# Patient Record
Sex: Female | Born: 1970
Health system: Southern US, Community
[De-identification: ages and names within clinical notes are randomized; demographics above are authoritative.]

## PROBLEM LIST (undated history)

## (undated) DIAGNOSIS — Z862 Personal history of diseases of the blood and blood-forming organs and certain disorders involving the immune mechanism: Secondary | ICD-10-CM

## (undated) DIAGNOSIS — T7840XA Allergy, unspecified, initial encounter: Secondary | ICD-10-CM

## (undated) DIAGNOSIS — I10 Essential (primary) hypertension: Secondary | ICD-10-CM

## (undated) HISTORY — DX: Personal history of diseases of the blood and blood-forming organs and certain disorders involving the immune mechanism: Z86.2

## (undated) HISTORY — DX: Essential (primary) hypertension: I10

## (undated) HISTORY — DX: Allergy, unspecified, initial encounter: T78.40XA

---

## 2002-01-09 HISTORY — PX: EYE SURGERY: SHX253

## 2010-01-09 DIAGNOSIS — Z862 Personal history of diseases of the blood and blood-forming organs and certain disorders involving the immune mechanism: Secondary | ICD-10-CM

## 2010-01-09 HISTORY — DX: Personal history of diseases of the blood and blood-forming organs and certain disorders involving the immune mechanism: Z86.2

## 2010-01-09 HISTORY — PX: EYE SURGERY: SHX253

## 2013-01-09 HISTORY — PX: EYE SURGERY: SHX253

## 2013-07-12 ENCOUNTER — Emergency Department (HOSPITAL_COMMUNITY)
Admission: EM | Admit: 2013-07-12 | Discharge: 2013-07-12 | Disposition: A | Discharge status: Home / Self Care | Payer: No Typology Code available for payment source | Attending: Emergency Medicine | Admitting: Emergency Medicine

## 2013-07-12 ENCOUNTER — Emergency Department (HOSPITAL_COMMUNITY): Payer: No Typology Code available for payment source

## 2013-07-12 ENCOUNTER — Encounter (HOSPITAL_COMMUNITY): Payer: Self-pay | Admitting: Emergency Medicine

## 2013-07-12 DIAGNOSIS — S6990XA Unspecified injury of unspecified wrist, hand and finger(s), initial encounter: Secondary | ICD-10-CM | POA: Diagnosis present

## 2013-07-12 DIAGNOSIS — Y9239 Other specified sports and athletic area as the place of occurrence of the external cause: Secondary | ICD-10-CM | POA: Diagnosis not present

## 2013-07-12 DIAGNOSIS — M25531 Pain in right wrist: Secondary | ICD-10-CM

## 2013-07-12 DIAGNOSIS — S59909A Unspecified injury of unspecified elbow, initial encounter: Secondary | ICD-10-CM | POA: Insufficient documentation

## 2013-07-12 DIAGNOSIS — Y92838 Other recreation area as the place of occurrence of the external cause: Secondary | ICD-10-CM

## 2013-07-12 DIAGNOSIS — S59919A Unspecified injury of unspecified forearm, initial encounter: Principal | ICD-10-CM

## 2013-07-12 DIAGNOSIS — Y9389 Activity, other specified: Secondary | ICD-10-CM | POA: Diagnosis not present

## 2013-07-12 MED ORDER — IBUPROFEN 800 MG PO TABS
800.0000 mg | ORAL_TABLET | Freq: Once | ORAL | Status: AC
  Administered 2013-07-12: 800 mg via ORAL
  Filled 2013-07-12: qty 1

## 2013-07-12 MED ORDER — HYDROCODONE-ACETAMINOPHEN 5-325 MG PO TABS
1.0000 | ORAL_TABLET | Freq: Once | ORAL | Status: DC
  Filled 2013-07-12: qty 1

## 2013-07-12 MED ORDER — IBUPROFEN 600 MG PO TABS: 600.0000 mg | ORAL_TABLET | Freq: Four times a day (QID) | ORAL | Status: DC | PRN

## 2013-07-12 MED ORDER — HYDROCODONE-ACETAMINOPHEN 5-325 MG PO TABS: 1.0000 | ORAL_TABLET | Freq: Four times a day (QID) | ORAL | Status: DC | PRN
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
## 2013-07-12 NOTE — ED Provider Notes (Signed)
Medical screening examination/treatment/procedure(s) were performed by non-physician practitioner and as supervising physician I was immediately available for consultation/collaboration.   EKG Interpretation None        Rudell Marlowe J. Jeyson Deshotel, MD 07/12/13 1525 
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
## 2016-01-21 ENCOUNTER — Ambulatory Visit (INDEPENDENT_AMBULATORY_CARE_PROVIDER_SITE_OTHER): Payer: PRIVATE HEALTH INSURANCE | Admitting: Family

## 2016-01-21 ENCOUNTER — Encounter: Payer: Self-pay | Admitting: Family

## 2016-01-21 VITALS — BP 125/77 | HR 100 | Temp 98.1°F | Resp 18 | Ht 67.0 in | Wt 204.0 lb

## 2016-01-21 DIAGNOSIS — Z862 Personal history of diseases of the blood and blood-forming organs and certain disorders involving the immune mechanism: Secondary | ICD-10-CM | POA: Diagnosis not present

## 2016-01-21 DIAGNOSIS — Z8669 Personal history of other diseases of the nervous system and sense organs: Secondary | ICD-10-CM | POA: Insufficient documentation

## 2016-01-21 DIAGNOSIS — I10 Essential (primary) hypertension: Secondary | ICD-10-CM | POA: Diagnosis not present

## 2016-01-21 DIAGNOSIS — Z0000 Encounter for general adult medical examination without abnormal findings: Secondary | ICD-10-CM

## 2016-01-21 LAB — CBC WITH DIFFERENTIAL/PLATELET
BASOS ABS: 0.1 10*3/uL (ref 0.0–0.1)
Basophils Relative: 0.9 % (ref 0.0–3.0)
EOS ABS: 0 10*3/uL (ref 0.0–0.7)
Eosinophils Relative: 0.2 % (ref 0.0–5.0)
HCT: 38.3 % (ref 36.0–46.0)
Hemoglobin: 12.9 g/dL (ref 12.0–15.0)
LYMPHS ABS: 2.3 10*3/uL (ref 0.7–4.0)
Lymphocytes Relative: 28.3 % (ref 12.0–46.0)
MCHC: 33.7 g/dL (ref 30.0–36.0)
MCV: 79.2 fl (ref 78.0–100.0)
MONOS PCT: 4.1 % (ref 3.0–12.0)
Monocytes Absolute: 0.3 10*3/uL (ref 0.1–1.0)
Neutro Abs: 5.5 10*3/uL (ref 1.4–7.7)
Neutrophils Relative %: 66.5 % (ref 43.0–77.0)
Platelets: 334 10*3/uL (ref 150.0–400.0)
RBC: 4.84 Mil/uL (ref 3.87–5.11)
RDW: 14.9 % (ref 11.5–15.5)
WBC: 8.3 10*3/uL (ref 4.0–10.5)

## 2016-01-21 LAB — BASIC METABOLIC PANEL
BUN: 20 mg/dL (ref 6–23)
CO2: 28 meq/L (ref 19–32)
CREATININE: 0.91 mg/dL (ref 0.40–1.20)
Calcium: 10.2 mg/dL (ref 8.4–10.5)
Chloride: 98 mEq/L (ref 96–112)
GFR: 70.88 mL/min (ref >60.00)
GLUCOSE: 88 mg/dL (ref 70–99)
Potassium: 3.3 mEq/L — ABNORMAL LOW (ref 3.5–5.1)
Sodium: 135 mEq/L (ref 135–145)

## 2016-01-21 MED ORDER — HYDROCHLOROTHIAZIDE 12.5 MG PO TABS: 12.5000 mg | ORAL_TABLET | Freq: Every day | ORAL | 1 refills | Status: DC

## 2016-01-21 NOTE — Progress Notes (Signed)
Subjective:    Patient ID: Evelyn Martin, female    DOB: 1970/11/21, 46 y.o.   MRN: [id number]  HPI  Ms. Evelyn Martin is a 46 yr old female who presents today to establish care.  [place]  Reports that she went to pharmacy and had BP 167/101, went to a clinic and was placed on hctz.  BP: 125/77 Had HA and dizziness daily until Saturday.   Hx of anemia (2012)- she reports that this was picked up incidentally on her annual exam.   Hx of detached retina right eye (scleral buccal), Macular hole right eye (s/p vitrectomy), has had laser surgery left eye for sign  Review of Systems  Constitutional: Negative for unexpected weight change.       Has lost 11 pounds since christmas with a new eating plan  HENT: Negative for rhinorrhea.   Eyes: Positive for visual disturbance.       Some vision in the lower left quadrant of the right eye  Respiratory: Negative for cough.   Cardiovascular: Negative for leg swelling.  Gastrointestinal: Negative for constipation and diarrhea.  Genitourinary: Negative for dysuria, frequency and menstrual problem.  Musculoskeletal: Negative for arthralgias and myalgias.  Neurological: Negative for headaches.  Hematological: Negative for adenopathy.  Psychiatric/Behavioral:       Denies depression/anxiety   Past Medical History:  Diagnosis Date  . History of anemia 2012     Social History   Social History  . Marital status: Single    Spouse name: N/A  . Number of children: N/A  . Years of education: N/A   Occupational History  . Not on file.   Social History Main Topics  . Smoking status: Never Smoker  . Smokeless tobacco: Never Used  . Alcohol use Yes     Comment: 2-3 beverages a month  . Drug use: No  . Sexual activity: Not on file   Other Topics Concern  . Not on file   Social History Narrative  . No narrative on file    Past Surgical History:  Procedure Laterality Date  . EYE SURGERY Right 2004   detached retnia  .  EYE SURGERY Right 2012   macular hole repair  . EYE SURGERY Left 2015   last retina repair    Family History  Problem Relation Age of Onset  . Hypertension Mother   . Hypertension Sister     No Known Allergies  No current outpatient prescriptions on file prior to visit.   No current facility-administered medications on file prior to visit.     BP 125/77 (BP Location: Left Arm, Cuff Size: Large)   Pulse 100   Temp 98.1 F (36.7 C) (Oral)   Resp 18   Ht 5\' 7"  (1.702 m)   Wt 204 lb (92.5 kg)   LMP 12/26/2015   SpO2 100% Comment: room air  BMI 31.95 kg/m       Objective:   Physical Exam  Constitutional: She is oriented to person, place, and time. She appears well-developed and well-nourished.  HENT:  Head: Normocephalic and atraumatic.  Right Ear: Tympanic membrane and ear canal normal.  Left Ear: Tympanic membrane and ear canal normal.  Cardiovascular: Normal rate, regular rhythm and normal heart sounds.   No murmur heard. Pulmonary/Chest: Effort normal and breath sounds normal. No respiratory distress. She has no wheezes.  Musculoskeletal: She exhibits no edema.  Neurological: She is alert and oriented to person, place, and time.  Psychiatric: She  has a normal mood and affect. Her behavior is normal. Judgment and thought content normal.          Assessment & Plan:

## 2016-01-21 NOTE — Assessment & Plan Note (Signed)
Clinically stable.  Obtain follow up cbc.  

## 2016-01-21 NOTE — Assessment & Plan Note (Signed)
Stable on low dose hctz, continue same.  Obtain follow up BMET.

## 2016-01-21 NOTE — Patient Instructions (Addendum)
Please complete lab work prior to leaving. You will be contacted about your referral to GYN and the Retinal Specialist.  [facility]

## 2016-01-21 NOTE — Assessment & Plan Note (Signed)
Clinically stable. Will refer to Dr. Luciana Axeankin (retinal specialist for ongoing care.

## 2016-01-23 ENCOUNTER — Other Ambulatory Visit: Payer: Self-pay | Admitting: Family

## 2016-01-23 DIAGNOSIS — E876 Hypokalemia: Secondary | ICD-10-CM

## 2016-01-23 NOTE — Telephone Encounter (Signed)
Please let pt know that potassium is low. I would like her to add kdur once daily and repeat bmet in 2 week.

## 2016-01-24 NOTE — Telephone Encounter (Signed)
Left message for pt to return my call.

## 2016-01-28 MED ORDER — POTASSIUM CHLORIDE CRYS ER 20 MEQ PO TBCR: 20.0000 meq | EXTENDED_RELEASE_TABLET | Freq: Every day | ORAL | 3 refills | Status: DC

## 2016-01-28 NOTE — Telephone Encounter (Signed)
Attempted to reach pt and left detailed message on cell# re: below instruction. Rx sent. Advised pt to call Monday to schedule lab visit in 2 week. Future order entered.

## 2016-02-14 ENCOUNTER — Encounter: Payer: Self-pay | Admitting: Family

## 2016-02-14 ENCOUNTER — Ambulatory Visit (INDEPENDENT_AMBULATORY_CARE_PROVIDER_SITE_OTHER): Payer: PRIVATE HEALTH INSURANCE | Admitting: Family

## 2016-02-14 ENCOUNTER — Telehealth: Payer: Self-pay | Admitting: Family

## 2016-02-14 VITALS — BP 130/77 | HR 97 | Temp 98.7°F | Ht 67.0 in | Wt 202.8 lb

## 2016-02-14 DIAGNOSIS — Z8669 Personal history of other diseases of the nervous system and sense organs: Secondary | ICD-10-CM

## 2016-02-14 DIAGNOSIS — E876 Hypokalemia: Secondary | ICD-10-CM | POA: Diagnosis not present

## 2016-02-14 DIAGNOSIS — I10 Essential (primary) hypertension: Secondary | ICD-10-CM | POA: Diagnosis not present

## 2016-02-14 LAB — BASIC METABOLIC PANEL
BUN: 14 mg/dL (ref 6–23)
CHLORIDE: 104 meq/L (ref 96–112)
CO2: 27 mEq/L (ref 19–32)
CREATININE: 0.92 mg/dL (ref 0.40–1.20)
Calcium: 10 mg/dL (ref 8.4–10.5)
GFR: 69.97 mL/min (ref >60.00)
Glucose, Bld: 116 mg/dL — ABNORMAL HIGH (ref 70–99)
Potassium: 3.9 mEq/L (ref 3.5–5.1)
Sodium: 137 mEq/L (ref 135–145)

## 2016-02-14 NOTE — Progress Notes (Signed)
   Subjective:    Patient ID: Evelyn Martin, female    DOB: Aug 21, 1970, 46 y.o.   MRN: [id number]  HPI  Ms. Evelyn Martin is a 46 yr old female who presents today for follow up.  1) HTN- she continues hctz. Has been watching her sodium intake.   Reports that last week she did not take Kdur over the weekend.  Felt fatigued and thought that Kdur might be the cause.    BP Readings from Last 3 Encounters:  02/14/16 130/77  01/21/16 125/77  07/12/13 135/61      Review of Systems See HPI  Past Medical History:  Diagnosis Date  . History of anemia 2012     Social History   Social History  . Marital status: Single    Spouse name: N/A  . Number of children: N/A  . Years of education: N/A   Occupational History  . Not on file.   Social History Main Topics  . Smoking status: Never Smoker  . Smokeless tobacco: Never Used  . Alcohol use Yes     Comment: 2-3 beverages a month  . Drug use: No  . Sexual activity: Not on file   Other Topics Concern  . 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 EYE SURGERY Right 2004   detached retnia  . EYE SURGERY Right 2012   macular hole repair  . EYE SURGERY Left 2015   last retina repair    Family History  Problem Relation Age of Onset  . Hypertension Mother   . Hypertension Sister     No Known Allergies  Current Outpatient Prescriptions on File Prior to Visit  Medication Sig Dispense Refill  . hydrochlorothiazide (HYDRODIURIL) 12.5 MG tablet Take 1 tablet (12.5 mg total) by mouth daily. 90 tablet 1  . potassium chloride SA (K-DUR,KLOR-CON) 20 MEQ tablet Take 1 tablet (20 mEq total) by mouth daily. 30 tablet 3   No current facility-administered medications on file prior to visit.     BP 130/77 (BP Location: Left Arm, Cuff Size: Large)   Pulse 97   Temp 98.7 F (37.1 C) (Oral)    Ht 5\' 7"  (1.702 m)   Wt 202 lb 12.8 oz (92 kg)   LMP 01/25/2016   SpO2 100%   BMI 31.76 kg/m       Objective:   Physical Exam  Constitutional: She is oriented to person, place, and time. She appears well-developed and well-nourished.  Cardiovascular: Normal rate, regular rhythm and normal heart sounds.   No murmur heard. Pulmonary/Chest: Effort normal and breath sounds normal. No respiratory distress. She has no wheezes.  Musculoskeletal: She exhibits no edema.  Neurological: She is alert and oriented to person, place, and time.  Psychiatric: She has a normal mood and affect. Her behavior is normal. Judgment and thought content normal.          Assessment & Plan:

## 2016-02-14 NOTE — Assessment & Plan Note (Signed)
BP stable, obtain follow up bmet today to follow up on her hypokalemia. Advised pt that fatigue was likely not secondary to kdur.

## 2016-02-14 NOTE — Telephone Encounter (Signed)
Dr Luciana Axeankin 02/29/16 @8 :45am [facility] [address] [place], [place] [address]  Tel: [phone] Pt aware, left detail message, ok per [facility]

## 2016-02-14 NOTE — Telephone Encounter (Signed)
Could you please follow up on referral to opthalmology. Pt states she has not been contacted.  Thanks.

## 2016-02-14 NOTE — Assessment & Plan Note (Signed)
She states she has not yet heard back from Dr. Ephriam Knucklesankin's office. Will check status of referral.

## 2016-02-14 NOTE — Patient Instructions (Signed)
Please complete lab work prior to leaving.   

## 2016-02-14 NOTE — Progress Notes (Signed)
Pre visit review using our clinic review tool, if applicable. No additional management support is needed unless otherwise documented below in the visit note. 
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
## 2016-05-09 ENCOUNTER — Encounter: Payer: Self-pay | Admitting: Family

## 2016-05-09 ENCOUNTER — Ambulatory Visit (INDEPENDENT_AMBULATORY_CARE_PROVIDER_SITE_OTHER): Payer: PRIVATE HEALTH INSURANCE | Admitting: Family

## 2016-05-09 VITALS — BP 136/79 | HR 82 | Temp 98.2°F | Resp 16 | Ht 67.0 in | Wt 196.2 lb

## 2016-05-09 DIAGNOSIS — Z23 Encounter for immunization: Secondary | ICD-10-CM | POA: Diagnosis not present

## 2016-05-09 DIAGNOSIS — Z0000 Encounter for general adult medical examination without abnormal findings: Secondary | ICD-10-CM

## 2016-05-09 LAB — URINALYSIS, ROUTINE W REFLEX MICROSCOPIC
Bilirubin Urine: NEGATIVE
Hgb urine dipstick: NEGATIVE
Ketones, ur: NEGATIVE
Leukocytes, UA: NEGATIVE
Nitrite: NEGATIVE
PH: 6.5 (ref 5.0–8.0)
RBC / HPF: NONE SEEN (ref 0–?)
SPECIFIC GRAVITY, URINE: 1.01 (ref 1.000–1.030)
Total Protein, Urine: NEGATIVE
Urine Glucose: NEGATIVE
Urobilinogen, UA: 0.2 (ref 0.0–1.0)

## 2016-05-09 LAB — BASIC METABOLIC PANEL
BUN: 17 mg/dL (ref 6–23)
CO2: 29 mEq/L (ref 19–32)
Calcium: 10 mg/dL (ref 8.4–10.5)
Chloride: 101 mEq/L (ref 96–112)
Creatinine, Ser: 1.04 mg/dL (ref 0.40–1.20)
GFR: 60.68 mL/min (ref >60.00)
Glucose, Bld: 103 mg/dL — ABNORMAL HIGH (ref 70–99)
Potassium: 3.9 mEq/L (ref 3.5–5.1)
SODIUM: 136 meq/L (ref 135–145)

## 2016-05-09 LAB — CBC WITH DIFFERENTIAL/PLATELET
BASOS PCT: 1.1 % (ref 0.0–3.0)
Basophils Absolute: 0.1 10*3/uL (ref 0.0–0.1)
EOS PCT: 1 % (ref 0.0–5.0)
Eosinophils Absolute: 0.1 10*3/uL (ref 0.0–0.7)
HCT: 38.8 % (ref 36.0–46.0)
HEMOGLOBIN: 12.8 g/dL (ref 12.0–15.0)
Lymphocytes Relative: 34.7 % (ref 12.0–46.0)
Lymphs Abs: 1.9 10*3/uL (ref 0.7–4.0)
MCHC: 33 g/dL (ref 30.0–36.0)
MCV: 80.2 fl (ref 78.0–100.0)
Monocytes Absolute: 0.3 10*3/uL (ref 0.1–1.0)
Monocytes Relative: 6.1 % (ref 3.0–12.0)
Neutro Abs: 3.1 10*3/uL (ref 1.4–7.7)
Neutrophils Relative %: 57.1 % (ref 43.0–77.0)
Platelets: 347 10*3/uL (ref 150.0–400.0)
RBC: 4.84 Mil/uL (ref 3.87–5.11)
RDW: 16.4 % — AB (ref 11.5–15.5)
WBC: 5.5 10*3/uL (ref 4.0–10.5)

## 2016-05-09 LAB — LIPID PANEL
CHOL/HDL RATIO: 4
Cholesterol: 196 mg/dL (ref 0–200)
HDL: 51.9 mg/dL (ref >39.00)
LDL Cholesterol: 113 mg/dL — ABNORMAL HIGH (ref 0–99)
NonHDL: 143.99
TRIGLYCERIDES: 157 mg/dL — AB (ref 0.0–149.0)
VLDL: 31.4 mg/dL (ref 0.0–40.0)

## 2016-05-09 LAB — HEPATIC FUNCTION PANEL
ALT: 41 U/L — ABNORMAL HIGH (ref 0–35)
AST: 23 U/L (ref 0–37)
Albumin: 4.5 g/dL (ref 3.5–5.2)
Alkaline Phosphatase: 45 U/L (ref 39–117)
BILIRUBIN DIRECT: 0.1 mg/dL (ref 0.0–0.3)
TOTAL PROTEIN: 8.2 g/dL (ref 6.0–8.3)
Total Bilirubin: 0.7 mg/dL (ref 0.2–1.2)

## 2016-05-09 LAB — TSH: TSH: 1.97 u[IU]/mL (ref 0.35–4.50)

## 2016-05-09 MED ORDER — POTASSIUM CHLORIDE CRYS ER 20 MEQ PO TBCR: 20.0000 meq | EXTENDED_RELEASE_TABLET | Freq: Every day | ORAL | 1 refills | Status: DC

## 2016-05-09 MED ORDER — HYDROCHLOROTHIAZIDE 12.5 MG PO TABS: 12.5000 mg | ORAL_TABLET | Freq: Every day | ORAL | 1 refills | Status: DC

## 2016-05-09 NOTE — Progress Notes (Addendum)
Subjective:    Patient ID: Evelyn Martin, female    DOB: 01-22-1970, 46 y.o.   MRN: [id number]  HPI   Patient presents today for complete physical.  Immunizations: ? Tetanus 2009 Diet: diet is on/off Exercise: 2 x a week crossfit, walks, gym etc.  Pap Smear: GYN Mammogram: 3 years   Saw [facility], he referred her to Dr. Dione Booze for Cataract.  Has cataract surgery scheduled for June 6th.     Review of Systems  Constitutional: Negative for unexpected weight change.  HENT: Negative for rhinorrhea.   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   Cardiovascular: Negative for leg swelling.  Gastrointestinal: Negative for constipation and diarrhea.  Genitourinary: Negative for dysuria, frequency and menstrual problem.  Musculoskeletal: Negative for arthralgias and myalgias.  Neurological: Negative for headaches.  Hematological: Negative for adenopathy.  Psychiatric/Behavioral:       Denies depression/anxiety       Past Medical History:  Diagnosis Date  . History of anemia 2012     Social History   Social History  . Marital status: Single    Spouse name: N/A  . Number of children: N/A  . Years of education: N/A   Occupational History  . Not on file.   Social History Main Topics  . Smoking status: Never Smoker  . Smokeless tobacco: Never Used  . Alcohol use Yes     Comment: 2-3 beverages a month  . Drug use: No  . Sexual activity: Not on file   Other Topics Concern  . Not on file   Social History Narrative   Lives with fiance   Works in Landscape architect   No children   No pets    From [place]    Past Surgical History:  Procedure Laterality Date  . EYE SURGERY Right 2004   detached retnia  . EYE SURGERY Right 2012   macular hole repair  . EYE SURGERY Left 2015   last retina repair    Family History  Problem Relation Age of Onset  . Hypertension Mother   . Hypertension Sister     No Known Allergies  Current  Outpatient Prescriptions on File Prior to Visit  Medication Sig Dispense Refill  . hydrochlorothiazide (HYDRODIURIL) 12.5 MG tablet Take 1 tablet (12.5 mg total) by mouth daily. 90 tablet 1  . potassium chloride SA (K-DUR,KLOR-CON) 20 MEQ tablet Take 1 tablet (20 mEq total) by mouth daily. 30 tablet 3   No current facility-administered medications on file prior to visit.     BP 136/79 (BP Location: Right Arm, Cuff Size: Large)   Pulse 82   Temp 98.2 F (36.8 C) (Oral)   Resp 16   Ht  (1.702 m)   Wt 196 lb 3.2 oz (89 kg)   LMP 04/22/2016   SpO2 100%   BMI 30.73 kg/m    Objective:   Physical Exam  Physical Exam  Constitutional: She is oriented to person, place, and time. She appears well-developed and well-nourished. No distress.  HENT:  Head: Normocephalic and atraumatic.  Right Ear: Tympanic membrane and ear canal normal.  Left Ear: Tympanic membrane and ear canal normal.  Mouth/Throat: Oropharynx is clear and moist.  Eyes: Pupils are equal, round, and reactive to light. No scleral icterus.  Neck: Normal range of motion. No thyromegaly present.  Cardiovascular: Normal rate and regular rhythm.   No murmur heard. Pulmonary/Chest: Effort normal and breath  sounds normal. No respiratory distress. He has no wheezes. She has no rales. She exhibits no tenderness.  Abdominal: Soft. Bowel sounds are normal. She exhibits no distension and no mass. There is no tenderness. There is no rebound and no guarding.  Musculoskeletal: She exhibits no edema.  Lymphadenopathy:    She has no cervical adenopathy.  Neurological: She is alert and oriented to person, place, and time. She has normal patellar reflexes. She exhibits normal muscle tone. Coordination normal.  Skin: Skin is warm and dry.  Psychiatric: She has a normal mood and affect. Her behavior is normal. Judgment and thought content normal.  Breast: refused Pelvic: deferred to GYN        Assessment & Plan:           Assessment & Plan:  Preventative Care- discussed healthy diet, exercise.  Refer for mammogram- she has a great deal of anxiety surrounding mammogram due to mother's hx of breast CA and previous hx of call back on last mammogram.  We discussed that early detection is key and support was provided. Will give tetanus today.  Obtain routine lab work. Pt will schedule pap smear. EKG tracing is personally reviewed.  EKG notes NSR.  No acute changes.

## 2016-05-09 NOTE — Addendum Note (Signed)
Addended by: Mervin Kung A on: 05/09/2016 04:42 PM   Modules accepted: Orders

## 2016-05-09 NOTE — Patient Instructions (Addendum)
Please complete lab work prior to leaving. Schedule pap smear with GYN. Continue healthy diet and regular exercise.

## 2016-05-09 NOTE — Progress Notes (Signed)
Pre visit review using our clinic review tool, if applicable. No additional management support is needed unless otherwise documented below in the visit note. 

## 2016-05-12 ENCOUNTER — Encounter: Payer: Self-pay | Admitting: Family

## 2016-07-09 HISTORY — PX: CATARACT EXTRACTION: SUR2

## 2016-10-25 ENCOUNTER — Ambulatory Visit (INDEPENDENT_AMBULATORY_CARE_PROVIDER_SITE_OTHER): Payer: PRIVATE HEALTH INSURANCE | Admitting: Family

## 2016-10-25 ENCOUNTER — Encounter: Payer: Self-pay | Admitting: Family

## 2016-10-25 DIAGNOSIS — S39012A Strain of muscle, fascia and tendon of lower back, initial encounter: Secondary | ICD-10-CM | POA: Diagnosis not present

## 2016-10-25 DIAGNOSIS — R300 Dysuria: Secondary | ICD-10-CM | POA: Diagnosis not present

## 2016-10-25 DIAGNOSIS — N3000 Acute cystitis without hematuria: Secondary | ICD-10-CM

## 2016-10-25 LAB — POC URINALSYSI DIPSTICK (AUTOMATED)
BILIRUBIN UA: NEGATIVE
Glucose, UA: NEGATIVE
KETONES UA: NEGATIVE
LEUKOCYTES UA: NEGATIVE
Nitrite, UA: NEGATIVE
PROTEIN UA: NEGATIVE
RBC UA: NEGATIVE
SPEC GRAV UA: 1.01 (ref 1.010–1.025)
Urobilinogen, UA: 0.2 E.U./dL
pH, UA: 6 (ref 5.0–8.0)

## 2016-10-25 MED ORDER — NITROFURANTOIN MONOHYD MACRO 100 MG PO CAPS: 100.0000 mg | ORAL_CAPSULE | Freq: Two times a day (BID) | ORAL | 0 refills | Status: DC

## 2016-10-25 MED ORDER — MELOXICAM 7.5 MG PO TABS: 7.5000 mg | ORAL_TABLET | Freq: Every day | ORAL | 0 refills | Status: DC

## 2016-10-25 MED FILL — NITROFURANTOIN MONO-MCR 100: 100 | 5 days supply | Qty: 10 | Fill #0

## 2016-10-25 MED FILL — MELOXICAM 7.5 MG TABLET: 7.5 | 14 days supply | Qty: 14 | Fill #0

## 2016-10-25 NOTE — Patient Instructions (Addendum)
Please begin macrodantin for urinary tract infection. Begin meloxicam for back pain. Call if new/worsening symptoms or if symptoms fail to improve.

## 2016-10-25 NOTE — Progress Notes (Signed)
Subjective:    Patient ID: Evelyn Martin, female    DOB: Feb 10, 1970, 46 y.o.   MRN: [id number]  HPI   Evelyn Martin is a 46 yr old female who presents today with chief complaint of dysuria. Started 4 days ago.  Has associated frequency, has pelvic pressure. Denies associated fever. Using cystex which helps with the pain.   Reports that she "turned wrong" on Saturday when she was lifting a generator. Using naproxen 500mg  which she had on hand. Felt better yesterday, but this AM worse again. Pain is located right lower back.  Worse with twisting and lifting the right leg. Denies LE numbness or radiation of pain down the leg. Reports it does hurt worse when she lifts her right leg.    Review of Systems Past Medical History:  Diagnosis Date  . History of anemia 2012     Social History   Social History  . Marital status: Single    Spouse name: N/A  . Number of children: N/A  . Years of education: N/A   Occupational History  . Not on file.   Social History Main Topics  . Smoking status: Never Smoker  . Smokeless tobacco: Never Used  . Alcohol use Yes     Comment: 2-3 beverages a month  . Drug use: No  . Sexual activity: Not on file   Other Topics Concern  . 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 EYE SURGERY Right 2004   detached retnia  . EYE SURGERY Right 2012   macular hole repair  . EYE SURGERY Left 2015   last retina repair    Family History  Problem Relation Age of Onset  . Hypertension Mother   . Breast cancer Mother 1555  . Hypertension Sister     No Known Allergies  Current Outpatient Prescriptions on File Prior to Visit  Medication Sig Dispense Refill  . hydrochlorothiazide (HYDRODIURIL) 12.5 MG tablet Take 1 tablet (12.5 mg total) by mouth daily. 90 tablet 1  . potassium chloride SA (K-DUR,KLOR-CON) 20  MEQ tablet Take 1 tablet (20 mEq total) by mouth daily. 90 tablet 1   No current facility-administered medications on file prior to visit.     BP 127/82 (BP Location: Right Arm, Cuff Size: Large)   Pulse 79   Temp 98.3 F (36.8 C) (Oral)   Resp 18   Ht 5\' 7"  (1.702 m)   Wt 207 lb 9.6 oz (94.2 kg)   LMP 10/11/2016   SpO2 100%   BMI 32.51 kg/m       Objective:   Physical Exam  Constitutional: She appears well-developed and well-nourished.  Cardiovascular: Normal rate, regular rhythm and normal heart sounds.   No murmur heard. Pulmonary/Chest: Effort normal and breath sounds normal. No respiratory distress. She has no wheezes.  Abdominal: Soft. There is tenderness in the suprapubic area. There is no CVA tenderness.  Musculoskeletal:       Cervical back: She exhibits no tenderness.       Thoracic back: She exhibits no tenderness.  Neurological:  Reflex Scores:      Patellar reflexes are 2+ on the right side and 2+ on the left side. Bilateral LE strength is 5/5  Psychiatric: She has a normal mood and affect. Her behavior is  normal. Judgment and thought content normal.          Assessment & Plan:  UTI- UA clear but symptomatic. Will send for culture and will rx macrobid.   Lumbar strain- rx with meloxicam. Offered muscle relaxer- she declines.

## 2016-10-26 LAB — URINE CULTURE
MICRO NUMBER:: 81159105
SPECIMEN QUALITY:: ADEQUATE

## 2016-11-07 ENCOUNTER — Ambulatory Visit (INDEPENDENT_AMBULATORY_CARE_PROVIDER_SITE_OTHER): Payer: PRIVATE HEALTH INSURANCE | Admitting: Internal Medicine

## 2016-11-07 ENCOUNTER — Encounter: Payer: Self-pay | Admitting: Internal Medicine

## 2016-11-07 VITALS — BP 128/80 | HR 97 | Temp 97.7°F | Resp 14 | Ht 67.0 in | Wt 208.0 lb

## 2016-11-07 DIAGNOSIS — J069 Acute upper respiratory infection, unspecified: Secondary | ICD-10-CM | POA: Diagnosis not present

## 2016-11-07 MED ORDER — HYDROCODONE-HOMATROPINE 5-1.5 MG/5ML PO SYRP: 5.0000 mL | ORAL_SOLUTION | Freq: Every evening | ORAL | 0 refills | Status: DC | PRN

## 2016-11-07 MED FILL — HYDROCODONE-HOMATROPINE SOL: 5-1.5 | 24 days supply | Qty: 120 | Fill #0

## 2016-11-07 NOTE — Progress Notes (Signed)
Subjective:    Patient ID: Evelyn Martin, female    DOB: Dec 12, 1970, 46 y.o.   MRN: [id number]  DOS:  11/07/2016 Type of visit - description : acute Interval history: Sx started 3-4 days ago with a dry cough, postnasal drip (mild). 2 days later, symptoms "went to the chest".  She feels some rattling and congestion. Taking TheraFlu, Robitussin, Mucinex D and other OTCs  on and off.   Review of Systems No actual fever chills No major problem with sinus pain or congestion No nausea vomiting No unusual aches or pains No headache. Not on BCP. LMP  About 3 weeks ago states she is certain is not pregnant.   Past Medical History:  Diagnosis Date  . History of anemia 2012    Past Surgical History:  Procedure Laterality Date  . EYE SURGERY Right 2004   detached retnia  . EYE SURGERY Right 2012   macular hole repair  . EYE SURGERY Left 2015   last retina repair    Social History   Social History  . Marital status: Single    Spouse name: N/A  . Number of children: N/A  . Years of education: N/A   Occupational History  . Not on file.   Social History Main Topics  . Smoking status: Never Smoker  . Smokeless tobacco: Never Used  . Alcohol use Yes     Comment: 2-3 beverages a month  . Drug use: No  . Sexual activity: Not on file   Other Topics Concern  . Not on file   Social History Narrative   Lives with fiance   Works in Landscape architect   No children   No pets    From [place]      Allergies as of 11/07/2016   No Known Allergies     Medication List       Accurate as of 11/07/16 11:59 PM. Always use your most recent med list.          hydrochlorothiazide 12.5 MG tablet Commonly known as:  HYDRODIURIL Take 1 tablet (12.5 mg total) by mouth daily.   HYDROcodone-homatropine 5-1.5 MG/5ML syrup Commonly known as:  HYCODAN Take 5 mLs by mouth at bedtime as needed for cough.   potassium chloride SA 20 MEQ tablet Commonly known as:   K-DUR,KLOR-CON Take 1 tablet (20 mEq total) by mouth daily.          Objective:   Physical Exam BP 128/80 (BP Location: Left Arm, Patient Position: Sitting, Cuff Size: Small)   Pulse 97   Temp 97.7 F (36.5 C) (Oral)   Resp 14   Ht 5\' 7"  (1.702 m)   Wt 208 lb (94.3 kg)   LMP 10/11/2016   SpO2 96%   BMI 32.58 kg/m  General:   Well developed, well nourished . NAD.  HEENT:  Normocephalic . Face symmetric, atraumatic.  TMs normal.  Nose is slightly congested, throat is symmetric, no redness or discharge.  Sinuses non-TTP Lungs:  CTA B Normal respiratory effort, no intercostal retractions, no accessory muscle use. Heart: RRR,  no murmur.  No pretibial edema bilaterally  Skin: Not pale. Not jaundice Neurologic:  alert & oriented X3.  Speech normal, gait appropriate for age and unassisted Psych--  Cognition and judgment appear intact.  Cooperative with normal attention span and concentration.  Behavior appropriate. No anxious or depressed appearing.      Assessment & Plan:  46 year old lady with history of hypertension, retinal detachment, presents  with  URI:  Continue with OTC cough suppressants, add  Flonase, hydrocodone at nighttime for severe cough, drowsiness and potential for abuse discussed. Call if not gradually better.

## 2016-11-07 NOTE — Progress Notes (Signed)
Pre visit review using our clinic review tool, if applicable. No additional management support is needed unless otherwise documented below in the visit note. 

## 2016-11-07 NOTE — Patient Instructions (Signed)
Rest, fluids , tylenol  For cough:  Take Mucinex DM twice a day as needed until better Take hydrocodone at night if the cough is severe, will make you drowsy    For nasal congestion: Use OTC  Flonase : 2 nasal sprays on each side of the nose in the morning until you feel better    Call if not gradually better over the next  10 days  Call anytime if the symptoms are severe

## 2016-11-11 ENCOUNTER — Ambulatory Visit (HOSPITAL_COMMUNITY)
Admission: EM | Admit: 2016-11-11 | Discharge: 2016-11-11 | Disposition: A | Discharge status: Home / Self Care | Payer: PRIVATE HEALTH INSURANCE | Attending: Family | Admitting: Family

## 2016-11-11 ENCOUNTER — Encounter (HOSPITAL_COMMUNITY): Payer: Self-pay | Admitting: Emergency Medicine

## 2016-11-11 DIAGNOSIS — J069 Acute upper respiratory infection, unspecified: Secondary | ICD-10-CM | POA: Diagnosis not present

## 2016-11-11 MED ORDER — ALBUTEROL SULFATE HFA 108 (90 BASE) MCG/ACT IN AERS: 1.0000 | INHALATION_SPRAY | Freq: Four times a day (QID) | RESPIRATORY_TRACT | 0 refills | Status: DC | PRN

## 2016-11-11 MED ORDER — AZITHROMYCIN 250 MG PO TABS: ORAL_TABLET | ORAL | 0 refills | Status: DC

## 2016-11-11 NOTE — ED Triage Notes (Signed)
Pt c/o cold sx onset: 9 days  Sx include: prod cough, PND, right ear fullness, chest tightness and dyspnea  Denies: fevers  Saw her PCP on Tuesday and was dx w/URI... Was given Hycodan w/temp relief.   Pt reports it's not getting any better.   Taking: OTC cold meds w/temp relief.   A&O x4... NAD

## 2016-11-11 NOTE — Discharge Instructions (Signed)
Start zpak Ensure to take probiotics while on antibiotics and also for 2 weeks after completion. It is important to re-colonize the gut with good bacteria and also to prevent any diarrheal infections associated with antibiotic use.   Increase intake of clear fluids. Congestion is best treated by hydration, when mucus is wetter, it is thinner, less sticky, and easier to expel from the body, either through coughing up drainage, or by blowing your nose.   Get plenty of rest.   Use saline nasal drops and blow your nose frequently. Run a humidifier at night and elevate the head of the bed. Vicks Vapor rub will help with congestion and cough. Steam showers and sinus massage for congestion.   Use Acetaminophen or Ibuprofen as needed for fever or pain. Avoid second hand smoke. Even the smallest exposure will worsen symptoms.   Over the counter medications you can try include Delsym for cough, a decongestant for congestion, and Mucinex or Robitussin as an expectorant. Be sure to just get the plain Mucinex or Robitussin that just has one medication (Guaifenesen). We don't recommend the combination products. Note, be sure to drink two glasses of water with each dose of Mucinex as the medication will not work well without adequate hydration.   You can also try a teaspoon of honey to see if this will help reduce cough. Throat lozenges can sometimes be beneficial as well.    This illness will typically last 7 - 10 days.   Please follow up with our clinic if you develop a fever greater than 101 F, symptoms worsen, or do not resolve in the next week.

## 2016-11-11 NOTE — ED Provider Notes (Signed)
[facility]    CSN: [id number] Arrival date & time: 11/11/16  1557     History   Chief Complaint Chief Complaint  Patient presents with  . URI    HPI Evelyn Martin is a 46 y.o. female.   CC:  Productive cough 9 days, worsening. Coughing all day, worse in latter part of day.   Endorses sob while coughing then resolves, chest tightness, right ear 'popping.' . Cough worse at bedtime.   NO wheeze, fever, chest pain, palpitations, nasal drainage. Tried mucinex dm, robitussin, theraflu with not  Much relief.   Saw her PCP 4 days ago, was given Hycodan cough syrup with temporary relief.  Has used albuterol inhaler in past.   H/o htn- compliant with medication  No h/o asthma   Non smoker        Past Medical History:  Diagnosis Date  . History of anemia 2012    Patient Active Problem List   Diagnosis Date Noted  . HTN (hypertension) 01/21/2016  . History of retinal detachment 01/21/2016  . History of anemia 01/21/2016    Past Surgical History:  Procedure Laterality Date  . EYE SURGERY Right 2004   detached retnia  . EYE SURGERY Right 2012   macular hole repair  . EYE SURGERY Left 2015   last retina repair    OB History    No data available       Home Medications    Prior to Admission medications   Medication Sig Start Date End Date Taking? Authorizing Provider  hydrochlorothiazide (HYDRODIURIL) 12.5 MG tablet Take 1 tablet (12.5 mg total) by mouth daily. 05/09/16  Yes Sandford Craze, NP  HYDROcodone-homatropine (HYCODAN) 5-1.5 MG/5ML syrup Take 5 mLs by mouth at bedtime as needed for cough. 11/07/16  Yes Paz, Nolon Rod, MD  potassium chloride SA (K-DUR,KLOR-CON) 20 MEQ tablet Take 1 tablet (20 mEq total) by mouth daily. 05/09/16  Yes Sandford Craze, NP  albuterol (PROVENTIL HFA;VENTOLIN HFA) 108 (90 Base) MCG/ACT inhaler Inhale 1-2 puffs into the lungs every 6 (six) hours as needed for wheezing or shortness of breath. 11/11/16    Allegra Grana, FNP  azithromycin (ZITHROMAX) 250 MG tablet Tale 500 mg PO on day 1, then 250 mg PO q24h x 4 days. 11/11/16   Allegra Grana, FNP    Family History Family History  Problem Relation Age of Onset  . Hypertension Mother   . Breast cancer Mother 60  . Hypertension Sister     Social History Social History  Substance Use Topics  . Smoking status: Never Smoker  . Smokeless tobacco: Never Used  . Alcohol use Yes     Comment: 2-3 beverages a month     Allergies   Patient has no known allergies.   Review of Systems Review of Systems  Constitutional: Negative for chills and fever.  HENT: Positive for congestion and ear pain. Negative for ear discharge, rhinorrhea, sinus pain and sore throat.   Eyes: Negative for visual disturbance.  Respiratory: Positive for cough and shortness of breath. Negative for wheezing.   Cardiovascular: Negative for chest pain and palpitations.  Gastrointestinal: Negative for nausea and vomiting.  Neurological: Negative for headaches.     Physical Exam Triage Vital Signs ED Triage Vitals  Enc Vitals Group     BP 11/11/16 1643 (!) 158/92     Pulse Rate 11/11/16 1643 85     Resp 11/11/16 1643 18     Temp 11/11/16 1643 98.5  F (36.9 C)     Temp Source 11/11/16 1643 Oral     SpO2 11/11/16 1643 100 %     Weight --      Height --      Head Circumference --      Peak Flow --      Pain Score 11/11/16 1644 6     Pain Loc --      Pain Edu? --      Excl. in GC? --    No data found.   Updated Vital Signs BP (!) 158/92 (BP Location: Right Arm)   Pulse 85   Temp 98.5 F (36.9 C) (Oral)   Resp 18   LMP 11/11/2016   SpO2 100%   Visual Acuity Right Eye Distance:   Left Eye Distance:   Bilateral Distance:    Right Eye Near:   Left Eye Near:    Bilateral Near:     Physical Exam  Constitutional: She appears well-developed and well-nourished.  HENT:  Head: Normocephalic and atraumatic.  Right Ear: Hearing, tympanic  membrane, external ear and ear canal normal. No drainage, swelling or tenderness. No foreign bodies. Tympanic membrane is not erythematous and not bulging. No middle ear effusion. No decreased hearing is noted.  Left Ear: Hearing, tympanic membrane, external ear and ear canal normal. No drainage, swelling or tenderness. No foreign bodies. Tympanic membrane is not erythematous and not bulging.  No middle ear effusion. No decreased hearing is noted.  Nose: Nose normal. No rhinorrhea. Right sinus exhibits no maxillary sinus tenderness and no frontal sinus tenderness. Left sinus exhibits no maxillary sinus tenderness and no frontal sinus tenderness.  Mouth/Throat: Uvula is midline, oropharynx is clear and moist and mucous membranes are normal. No oropharyngeal exudate, posterior oropharyngeal edema, posterior oropharyngeal erythema or tonsillar abscesses.  Eyes: Conjunctivae are normal.  Cardiovascular: Regular rhythm, normal heart sounds and normal pulses.   Pulmonary/Chest: Effort normal and breath sounds normal. She has no wheezes. She has no rhonchi. She has no rales.  Lymphadenopathy:       Head (right side): No submental, no submandibular, no tonsillar, no preauricular, no posterior auricular and no occipital adenopathy present.       Head (left side): No submental, no submandibular, no tonsillar, no preauricular, no posterior auricular and no occipital adenopathy present.    She has no cervical adenopathy.  Neurological: She is alert.  Skin: Skin is warm and dry.  Psychiatric: She has a normal mood and affect. Her speech is normal and behavior is normal. Thought content normal.  Vitals reviewed.    UC Treatments / Results  Labs (all labs ordered are listed, but only abnormal results are displayed) Labs Reviewed - No data to display  EKG  EKG Interpretation None       Radiology No results found.  Procedures Procedures (including critical care time)  Medications Ordered in  UC Medications - No data to display   Initial Impression / Assessment and Plan / UC Course  I have reviewed the triage vital signs and the nursing notes.  Pertinent labs & imaging results that were available during my care of the patient were reviewed by me and considered in my medical decision making (see chart for details).       Final Clinical Impressions(s) / UC Diagnoses   Final diagnoses:  Viral upper respiratory tract infection  Patient well-appearing in exam. Sao2 100%. Rechecked Bp 151/62. Suspect coughing contributory to elevated BP; patient will monitor at home.  Based on duration of symptoms, jointly agreed was reasonable to start antibiotic therapy at this time. Albuterol inhaler given as needed. Return precautions given.  New Prescriptions Discharge Medication List as of 11/11/2016  5:09 PM    START taking these medications   Details  albuterol (PROVENTIL HFA;VENTOLIN HFA) 108 (90 Base) MCG/ACT inhaler Inhale 1-2 puffs into the lungs every 6 (six) hours as needed for wheezing or shortness of breath., Starting Sat 11/11/2016, Normal    azithromycin (ZITHROMAX) 250 MG tablet Tale 500 mg PO on day 1, then 250 mg PO q24h x 4 days., Normal         Controlled Substance Prescriptions [place] Controlled Substance Registry consulted? Not Applicable   Allegra Granarnett, Margaret G, FNP 11/11/16 1821

## 2016-11-15 ENCOUNTER — Encounter: Payer: Self-pay | Admitting: Family

## 2016-11-15 ENCOUNTER — Ambulatory Visit (INDEPENDENT_AMBULATORY_CARE_PROVIDER_SITE_OTHER): Payer: PRIVATE HEALTH INSURANCE | Admitting: Family

## 2016-11-15 VITALS — BP 120/78 | HR 81 | Temp 98.4°F | Resp 16 | Ht 67.0 in | Wt 206.8 lb

## 2016-11-15 DIAGNOSIS — I10 Essential (primary) hypertension: Secondary | ICD-10-CM | POA: Diagnosis not present

## 2016-11-15 DIAGNOSIS — J069 Acute upper respiratory infection, unspecified: Secondary | ICD-10-CM

## 2016-11-15 LAB — BASIC METABOLIC PANEL
BUN: 12 mg/dL (ref 6–23)
CO2: 30 mEq/L (ref 19–32)
Calcium: 10 mg/dL (ref 8.4–10.5)
Chloride: 103 mEq/L (ref 96–112)
Creatinine, Ser: 0.94 mg/dL (ref 0.40–1.20)
GFR: 68.03 mL/min (ref >60.00)
GLUCOSE: 113 mg/dL — AB (ref 70–99)
POTASSIUM: 5 meq/L (ref 3.5–5.1)
Sodium: 138 mEq/L (ref 135–145)

## 2016-11-15 MED ORDER — POTASSIUM CHLORIDE CRYS ER 20 MEQ PO TBCR
20.0000 meq | EXTENDED_RELEASE_TABLET | Freq: Every day | ORAL | 1 refills | Status: DC
Start: 2016-11-15 — End: 2017-05-23

## 2016-11-15 MED ORDER — HYDROCHLOROTHIAZIDE 12.5 MG PO TABS: 12.5000 mg | ORAL_TABLET | Freq: Every day | ORAL | 1 refills | Status: DC

## 2016-11-15 MED FILL — POTASSIUM CL ER 20 MEQ TABL: 20 | 90 days supply | Qty: 90 | Fill #0

## 2016-11-15 MED FILL — HYDROCHLOROTHIAZIDE 12.5 MG: 12.5 | 90 days supply | Qty: 90 | Fill #0

## 2016-11-15 NOTE — Patient Instructions (Signed)
Please complete lab work prior to leaving.   

## 2016-11-15 NOTE — Progress Notes (Signed)
Subjective:    Patient ID: Evelyn Martin, female    DOB: Jun 27, 1970, 46 y.o.   MRN: [id number]  HPI   HTN- maintained on hctz.  Continues Kdur, denies SOB, swelling.  BP Readings from Last 3 Encounters:  11/15/16 120/78  11/11/16 (!) 158/92  11/07/16 128/80   URI- saw NP on 11/3 at urgent care.  Took last dose of zpak today, reports that she is finally feeling better. Wants to wait a week or so before she takes the flu shot.    Review of Systems See HPI  Past Medical History:  Diagnosis Date  . History of anemia 2012     Social History   Socioeconomic History  . Marital status: Single    Spouse name: Not on file  . Number of children: Not on file  . Years of education: Not on file  . Highest education level: Not on file  Social Needs  . Financial resource strain: Not on file  . Food insecurity - worry: Not on file  . Food insecurity - inability: Not on file  . Transportation needs - medical: Not on file  . Transportation needs - non-medical: Not on file  Occupational History  . Not on file  Tobacco Use  . Smoking status: Never Smoker  . Smokeless tobacco: Never Used  Substance and Sexual Activity  . Alcohol use: Yes    Comment: 2-3 beverages a month  . Drug use: No  . Sexual activity: Not on file  Other Topics Concern  . Not on file  Social History Narrative   Lives with fiance   Works in Landscape architectfurniture industry   No children   No pets    From [place]    Past Surgical History:  Procedure Laterality Date  . EYE SURGERY Right 2004   detached retnia  . EYE SURGERY Right 2012   macular hole repair  . EYE SURGERY Left 2015   last retina repair    Family History  Problem Relation Age of Onset  . Hypertension Mother   . Breast cancer Mother 8455  . Hypertension Sister     No Known Allergies  Current Outpatient Medications on File Prior to Visit  Medication Sig Dispense Refill  . albuterol (PROVENTIL HFA;VENTOLIN HFA) 108 (90 Base) MCG/ACT inhaler  Inhale 1-2 puffs into the lungs every 6 (six) hours as needed for wheezing or shortness of breath. 1 Inhaler 0  . azithromycin (ZITHROMAX) 250 MG tablet Tale 500 mg PO on day 1, then 250 mg PO q24h x 4 days. 6 tablet 0  . hydrochlorothiazide (HYDRODIURIL) 12.5 MG tablet Take 1 tablet (12.5 mg total) by mouth daily. 90 tablet 1  . potassium chloride SA (K-DUR,KLOR-CON) 20 MEQ tablet Take 1 tablet (20 mEq total) by mouth daily. 90 tablet 1   No current facility-administered medications on file prior to visit.     BP 120/78 (BP Location: Left Arm, Cuff Size: Large)   Pulse 81   Temp 98.4 F (36.9 C) (Oral)   Resp 16   Ht 5\' 7"  (1.702 m)   Wt 206 lb 12.8 oz (93.8 kg)   LMP 11/11/2016   SpO2 100%   BMI 32.39 kg/m       Objective:   Physical Exam  Constitutional: She appears well-developed and well-nourished.  HENT:  Head: Normocephalic and atraumatic.  Right Ear: Tympanic membrane and ear canal normal.  Left Ear: Tympanic membrane and ear canal normal.  Mouth/Throat: No oropharyngeal exudate, posterior  oropharyngeal edema or posterior oropharyngeal erythema.  Cardiovascular: Normal rate, regular rhythm and normal heart sounds.  No murmur heard. Pulmonary/Chest: Effort normal and breath sounds normal. No respiratory distress. She has no wheezes.  Lymphadenopathy:    She has no cervical adenopathy.  Psychiatric: She has a normal mood and affect. Her behavior is normal. Judgment and thought content normal.          Assessment & Plan:  HTN- BP stable on hctz, continue same along with Kdur, obtain follow up bmet.   URI- resolving.

## 2016-11-17 ENCOUNTER — Telehealth: Payer: Self-pay | Admitting: Family

## 2016-11-17 ENCOUNTER — Ambulatory Visit (HOSPITAL_BASED_OUTPATIENT_CLINIC_OR_DEPARTMENT_OTHER)
Admission: RE | Admit: 2016-11-17 | Discharge: 2016-11-17 | Disposition: A | Discharge status: Home / Self Care | Payer: PRIVATE HEALTH INSURANCE | Source: Ambulatory Visit | Attending: Family | Admitting: Family

## 2016-11-17 DIAGNOSIS — R05 Cough: Secondary | ICD-10-CM | POA: Insufficient documentation

## 2016-11-17 DIAGNOSIS — R059 Cough, unspecified: Secondary | ICD-10-CM

## 2016-11-17 MED ORDER — BENZONATATE 100 MG PO CAPS: 100.0000 mg | ORAL_CAPSULE | Freq: Three times a day (TID) | ORAL | 0 refills | Status: DC | PRN

## 2016-11-17 MED FILL — BENZONATATE 100 MG CAPSULE: 100 | 7 days supply | Qty: 20 | Fill #0

## 2016-11-17 NOTE — Telephone Encounter (Signed)
Pt says that he was seen 3 times for a upper respiratory infection. Pt says that she received medication but is not feeling any better. Pt says that she have completed the z-pack but her coughing is still the same   Pt would like to be advised further.    CB: [phone]

## 2016-11-17 NOTE — Telephone Encounter (Signed)
Patient informed, understood & agreed to come in today for Xray; she just asked to be called with results and requested that she would like to have an Rx called in after the Xray/SLS 11/09

## 2016-11-17 NOTE — Telephone Encounter (Signed)
Left detailed message on cell phone, cxr normal, start tessalon prn as well as claritin once daily. Call if symptoms worsen or fail to improve.

## 2016-11-17 NOTE — Telephone Encounter (Signed)
Cough can last several weeks, but lets have her complete a cxr this afternoon to make sure she does not have PNA.

## 2016-11-22 ENCOUNTER — Ambulatory Visit (INDEPENDENT_AMBULATORY_CARE_PROVIDER_SITE_OTHER): Payer: PRIVATE HEALTH INSURANCE

## 2016-11-22 DIAGNOSIS — Z23 Encounter for immunization: Secondary | ICD-10-CM

## 2017-03-21 ENCOUNTER — Encounter: Payer: Self-pay | Admitting: Physician Assistant

## 2017-03-21 ENCOUNTER — Ambulatory Visit (INDEPENDENT_AMBULATORY_CARE_PROVIDER_SITE_OTHER): Payer: BLUE CROSS/BLUE SHIELD | Admitting: Physician Assistant

## 2017-03-21 ENCOUNTER — Other Ambulatory Visit: Payer: Self-pay

## 2017-03-21 VITALS — BP 138/94 | HR 96 | Temp 98.3°F | Resp 16 | Ht 68.0 in | Wt 205.6 lb

## 2017-03-21 DIAGNOSIS — J019 Acute sinusitis, unspecified: Secondary | ICD-10-CM

## 2017-03-21 DIAGNOSIS — J3489 Other specified disorders of nose and nasal sinuses: Secondary | ICD-10-CM

## 2017-03-21 LAB — POC INFLUENZA A&B (BINAX/QUICKVUE)
Influenza A, POC: NEGATIVE
Influenza B, POC: NEGATIVE

## 2017-03-21 MED ORDER — AMOXICILLIN-POT CLAVULANATE 875-125 MG PO TABS: 1.0000 | ORAL_TABLET | Freq: Two times a day (BID) | ORAL | 0 refills | Status: AC

## 2017-03-21 NOTE — Patient Instructions (Addendum)
Start taking Augmentin twice daily for the next 7 days.   Stay well hydrated. Get lost of rest. Wash your hands often.  -Foods that can help speed recovery: honey, garlic, chicken soup, elderberries, green tea.  -Supplements that can help speed recovery: vitamin C, zinc, elderberry extract, quercetin, ginseng, selenium -Supplement with prebiotics and probiotics.   Advil or ibuprofen for pain. Do not take Aspirin.  Drink enough water and fluids to keep your urine clear or pale yellow.  For sore throat: ? Gargle with 8 oz of salt water ( tsp of salt per 1 qt of water) as often as every 1-2 hours to soothe your throat.  Gargle liquid benadryl.  Cepacol throat lozenges (if you are not at risk for choking).  For sore throat try using a honey-based tea. Use 3 teaspoons of honey with juice squeezed from half lemon. Place shaved pieces of ginger into 1/2-1 cup of water and warm over stove top. Then mix the ingredients and repeat every 4 hours as needed.  Cough Syrup Recipe: Sweet Lemon & Honey Thyme  Ingredients a handful of fresh thyme sprigs   1 pint of water (2 cups)  1/2 cup honey (raw is best, but regular will do)  1/2 lemon chopped Instructions 1. Place the lemon in the pint jar and cover with the honey. The honey will macerate the lemons and draw out liquids which taste so delicious! 2. Meanwhile, toss the thyme leaves into a saucepan and cover them with the water. 3. Bring the water to a gentle simmer and reduce it to half, about a cup of tea. 4. When the tea is reduced and cooled a bit, strain the sprigs & leaves, add it into the pint jar and stir it well. 5. Give it a shake and use a spoonful as needed. 6. Store your homemade cough syrup in the refrigerator for about a month.   ACUTE VIRAL RHINOSINUSITIS-Patients with acute viral rhinosinusitis (AVRS) should be managed with supportive care. There are no treatments to shorten the clinical course of the disease.  Natural  history-AVRS may not completely resolve within 10 days but is expected to improve. Patients who fail to improve after ?10 days of symptomatic management are more likely to have acute bacterial rhinosinusitis. Symptomatic therapies-Symptomatic management of acute rhinosinusitis (ARS) aims to relieve symptoms of nasal obstruction and runny nose as well as the systemic signs and symptoms such as fever and fatigue. When needed, we suggest over-the-counter (OTC) analgesics and antipyretics, saline irrigation, and intranasal glucocorticoids for symptomatic management in patients with ARS. Analgesics and antipyretics-OTC analgesics and antipyretics such as nonsteroidal anti-inflammatory drugs and acetaminophen can be used for pain and fever relief as needed. Saline irrigation-Mechanical irrigation with saline may reduce the need for pain medication and improve overall patient comfort, particularly in patients with frequent sinus infections. It is important that irrigants be prepared from sterile or bottled water. (See below for instructions)  Intranasal glucocorticoids- Intranasal glucocorticoids are likely to be most beneficial for patients with underlying allergies. This allows improved sinus drainage. A higher dose of intranasal glucocorticoids had a stronger effect on symptom improvement. -Other- ?Oral decongestants - Oral decongestants may be useful when eustachian tube dysfunction is a factor for patients with AVRS. These patients may benefit from a short course (three to five days) of oral decongestants. Oral decongestants should be used with caution in patients with cardiovascular disease, hypertension, angle-closure glaucoma, or bladder neck obstruction. ?Intranasal decongestants - Intranasal decongestants are often used as symptomatic  therapies by patients. These agents, such as oxymetazoline, may provide a subjective sense of improved nasal patency. There is also concern that intranasal  decongestants themselves may provoke mucosal inflammation. If used, topical decongestants should be used sparingly for no more than three consecutive days to avoid rebound congestion, addiction, and mucosal damage associated with long-term use. ?Antihistamines - Antihistamines are frequently used for symptom relief due to their drying effects; however, there are no studies investigating their efficacy for ARS. Over-drying of the mucosa may lead to further discomfort. Additionally, antihistamines are often associated with adverse effects.  ?Mucolytics - Mucolytics such as guaifenesin serve to thin secretions and may promote ease of mucus drainage and clearance.  Thank you for coming in today. I hope you feel we met your needs.  Feel free to call PCP if you have any questions or further requests.  Please consider signing up for MyChart if you do not already have it, as this is a great way to communicate with me.  Best,  Whitney McVey, PA-C  IF you received an x-ray today, you will receive an invoice from [facility]. Please contact [facility] at [phone] with questions or concerns regarding your invoice.   IF you received labwork today, you will receive an invoice from [facility]. Please contact [facility] at [phone] with questions or concerns regarding your invoice.   Our billing staff will not be able to assist you with questions regarding bills from these companies.  You will be contacted with the lab results as soon as they are available. The fastest way to get your results is to activate your My Chart account. Instructions are located on the last page of this paperwork. If you have not heard from [place] regarding the results in 2 weeks, please contact this office.

## 2017-03-21 NOTE — Progress Notes (Signed)
   Guss BundeKelly A Giraldo  MRN: [id number] DOB: [birth year]  PCP: Sandford Craze'Sullivan, Melissa, NP  Subjective:  Pt is a 47 year old female who presents to clinic for sinus pain x 9 days.  Endorses cough, nasal drainage and body aches. Symptoms worsened last night.  She has taken Mucinex-d, tylenol cold and flu, theraflu, zyrtec.  No known sick contacts.   Review of Systems  Constitutional: Positive for fatigue. Negative for chills, diaphoresis and fever.  HENT: Positive for congestion, postnasal drip, rhinorrhea, sinus pressure and sinus pain. Negative for sore throat.   Respiratory: Positive for cough. Negative for shortness of breath and wheezing.   Psychiatric/Behavioral: Positive for sleep disturbance.    Patient Active Problem List   Diagnosis Date Noted  . HTN (hypertension) 01/21/2016  . History of retinal detachment 01/21/2016  . History of anemia 01/21/2016    Current Outpatient Medications on File Prior to Visit  Medication Sig Dispense Refill  . hydrochlorothiazide (HYDRODIURIL) 12.5 MG tablet Take 1 tablet (12.5 mg total) daily by mouth. 90 tablet 1  . potassium chloride SA (K-DUR,KLOR-CON) 20 MEQ tablet Take 1 tablet (20 mEq total) daily by mouth. 90 tablet 1  . albuterol (PROVENTIL HFA;VENTOLIN HFA) 108 (90 Base) MCG/ACT inhaler Inhale 1-2 puffs into the lungs every 6 (six) hours as needed for wheezing or shortness of breath. (Patient not taking: Reported on 03/21/2017) 1 Inhaler 0   No current facility-administered medications on file prior to visit.     No Known Allergies   Objective:  BP (!) 138/94   Pulse 96   Temp 98.3 F (36.8 C) (Oral)   Resp 16   Ht 5\' 8"  (1.727 m)   Wt 205 lb 9.6 oz (93.3 kg)   LMP 03/07/2017   SpO2 100%   BMI 31.26 kg/m   Physical Exam  Constitutional: She is oriented to person, place, and time and well-developed, well-nourished, and in no distress. No distress.  HENT:  Right Ear: Tympanic membrane normal.  Left Ear: Tympanic membrane  normal.  Nose: Mucosal edema present. No rhinorrhea. Right sinus exhibits maxillary sinus tenderness. Right sinus exhibits no frontal sinus tenderness. Left sinus exhibits maxillary sinus tenderness. Left sinus exhibits no frontal sinus tenderness.  Mouth/Throat: Oropharynx is clear and moist and mucous membranes are normal.  Cardiovascular: Normal rate, regular rhythm and normal heart sounds.  Pulmonary/Chest: Effort normal and breath sounds normal. No respiratory distress. She has no wheezes. She has no rales.  Neurological: She is alert and oriented to person, place, and time. GCS score is 15.  Skin: Skin is warm and dry.  Psychiatric: Mood, memory, affect and judgment normal.  Vitals reviewed.   Assessment and Plan :  1. Acute non-recurrent sinusitis, unspecified location - amoxicillin-clavulanate (AUGMENTIN) 875-125 MG tablet; Take 1 tablet by mouth 2 (two) times daily for 7 days.  Dispense: 14 tablet; Refill: 0 - pt presents with worsening sinus pressure x 9 days. Plan to cover with augmentin. RTC if no improvement.  2. Sinus pressure - POC Influenza A&B (Binax test)  Marco CollieWhitney Florentina Marquart, PA-C  [facility] 03/21/2017 11:53 AM

## 2017-03-26 ENCOUNTER — Ambulatory Visit: Payer: PRIVATE HEALTH INSURANCE | Admitting: Family Medicine

## 2017-05-18 ENCOUNTER — Telehealth: Payer: Self-pay | Admitting: Family

## 2017-05-18 MED ORDER — HYDROCHLOROTHIAZIDE 12.5 MG PO TABS: 12.5000 mg | ORAL_TABLET | Freq: Every day | ORAL | 0 refills | Status: DC

## 2017-05-18 NOTE — Telephone Encounter (Signed)
Copied from CRM [id number]. Topic: Quick Communication - Rx Refill/Question >> May 18, 2017 10:59 AM Maia Petties wrote: Medication: hydrochlorothiazide (HYDRODIURIL) 12.5 MG tablet  - pt took last pill today - she has appt scheduled Wednesday 05/23/17 Has the patient contacted their pharmacy? No - no refills Preferred Pharmacy (with phone number or street name): [facility] [address] [place] - [address] [phone] (Phone) [phone] (Fax)

## 2017-05-23 ENCOUNTER — Ambulatory Visit (INDEPENDENT_AMBULATORY_CARE_PROVIDER_SITE_OTHER): Payer: BLUE CROSS/BLUE SHIELD | Admitting: Family

## 2017-05-23 ENCOUNTER — Encounter: Payer: Self-pay | Admitting: Family

## 2017-05-23 VITALS — BP 137/81 | HR 96 | Temp 98.4°F | Resp 16 | Ht 67.0 in | Wt 207.2 lb

## 2017-05-23 DIAGNOSIS — F419 Anxiety disorder, unspecified: Secondary | ICD-10-CM

## 2017-05-23 DIAGNOSIS — Z0000 Encounter for general adult medical examination without abnormal findings: Secondary | ICD-10-CM | POA: Diagnosis not present

## 2017-05-23 LAB — HEPATIC FUNCTION PANEL
ALBUMIN: 4.6 g/dL (ref 3.5–5.2)
ALK PHOS: 45 U/L (ref 39–117)
ALT: 14 U/L (ref 0–35)
AST: 14 U/L (ref 0–37)
Bilirubin, Direct: 0.1 mg/dL (ref 0.0–0.3)
Total Bilirubin: 0.9 mg/dL (ref 0.2–1.2)
Total Protein: 8.3 g/dL (ref 6.0–8.3)

## 2017-05-23 LAB — URINALYSIS, ROUTINE W REFLEX MICROSCOPIC
BILIRUBIN URINE: NEGATIVE
Hgb urine dipstick: NEGATIVE
KETONES UR: NEGATIVE
LEUKOCYTES UA: NEGATIVE
Nitrite: NEGATIVE
PH: 7 (ref 5.0–8.0)
RBC / HPF: NONE SEEN (ref 0–?)
TOTAL PROTEIN, URINE-UPE24: NEGATIVE
UROBILINOGEN UA: 0.2 (ref 0.0–1.0)
Urine Glucose: NEGATIVE

## 2017-05-23 LAB — CBC WITH DIFFERENTIAL/PLATELET
BASOS ABS: 0.1 10*3/uL (ref 0.0–0.1)
BASOS PCT: 1.3 % (ref 0.0–3.0)
EOS PCT: 0.6 % (ref 0.0–5.0)
Eosinophils Absolute: 0 10*3/uL (ref 0.0–0.7)
HCT: 38.1 % (ref 36.0–46.0)
HEMOGLOBIN: 12.2 g/dL (ref 12.0–15.0)
LYMPHS ABS: 2.1 10*3/uL (ref 0.7–4.0)
LYMPHS PCT: 32 % (ref 12.0–46.0)
MCHC: 31.9 g/dL (ref 30.0–36.0)
MCV: 78.4 fl (ref 78.0–100.0)
Monocytes Absolute: 0.3 10*3/uL (ref 0.1–1.0)
Monocytes Relative: 5.1 % (ref 3.0–12.0)
NEUTROS ABS: 3.9 10*3/uL (ref 1.4–7.7)
Neutrophils Relative %: 61 % (ref 43.0–77.0)
PLATELETS: 392 10*3/uL (ref 150.0–400.0)
RBC: 4.86 Mil/uL (ref 3.87–5.11)
RDW: 16.9 % — ABNORMAL HIGH (ref 11.5–15.5)
WBC: 6.5 10*3/uL (ref 4.0–10.5)

## 2017-05-23 LAB — LIPID PANEL
Cholesterol: 194 mg/dL (ref 0–200)
HDL: 51.5 mg/dL (ref >39.00)
LDL Cholesterol: 115 mg/dL — ABNORMAL HIGH (ref 0–99)
NONHDL: 142.49
Total CHOL/HDL Ratio: 4
Triglycerides: 135 mg/dL (ref 0.0–149.0)
VLDL: 27 mg/dL (ref 0.0–40.0)

## 2017-05-23 LAB — TSH: TSH: 2.35 u[IU]/mL (ref 0.35–4.50)

## 2017-05-23 LAB — BASIC METABOLIC PANEL
BUN: 11 mg/dL (ref 6–23)
CALCIUM: 10.2 mg/dL (ref 8.4–10.5)
CHLORIDE: 102 meq/L (ref 96–112)
CO2: 26 mEq/L (ref 19–32)
Creatinine, Ser: 0.96 mg/dL (ref 0.40–1.20)
GFR: 66.24 mL/min (ref >60.00)
GLUCOSE: 116 mg/dL — AB (ref 70–99)
POTASSIUM: 5 meq/L (ref 3.5–5.1)
SODIUM: 139 meq/L (ref 135–145)

## 2017-05-23 MED ORDER — HYDROCHLOROTHIAZIDE 12.5 MG PO TABS: 12.5000 mg | ORAL_TABLET | Freq: Every day | ORAL | 1 refills | Status: DC

## 2017-05-23 MED ORDER — POTASSIUM CHLORIDE CRYS ER 20 MEQ PO TBCR: 20.0000 meq | EXTENDED_RELEASE_TABLET | Freq: Every day | ORAL | 1 refills | Status: DC

## 2017-05-23 MED ORDER — ALPRAZOLAM 0.25 MG PO TABS: 0.2500 mg | ORAL_TABLET | Freq: Every day | ORAL | 0 refills | Status: DC | PRN

## 2017-05-23 NOTE — Progress Notes (Signed)
Subjective:    Patient ID: Evelyn Martin, female    DOB: [birth year], 47 y.o.   MRN: [id number]  HPI  Patient presents today for complete physical.  Immunizations: tdap 2018 Diet: healthy Exercise: not exercising Pap Smear: due- will see GYN Mammogram: due Vision: sees retinal specialist Dental:  due   Wt Readings from Last 3 Encounters:  05/23/17 207 lb 3.2 oz (94 kg)  03/21/17 205 lb 9.6 oz (93.3 kg)  11/15/16 206 lb 12.8 oz (93.8 kg)   Anxiety- notes that she did not follow through with GYN/Mammo last year as instructed due to severe anxiety surrounding these appointments. Her mother had breast cancer and she is very fearful that she will be called back on her mammogram.  Requesting something for anxiety to help get her through these appointments.   Review of Systems  Constitutional: Negative for unexpected weight change.  HENT: Negative for hearing loss and rhinorrhea.   Respiratory: Negative for cough.   Cardiovascular: Negative for leg swelling.  Genitourinary: Negative for dysuria, frequency and hematuria.  Musculoskeletal: Negative for arthralgias and myalgias.  Skin:       Occasional eczema  Neurological: Negative for headaches.  Hematological: Negative for adenopathy.  Psychiatric/Behavioral:       Denies depression   Past Medical History:  Diagnosis Date  . Allergy   . History of anemia 2012  . Hypertension      Social History   Socioeconomic History  . Marital status: Single    Spouse name: Not on file  . Number of children: Not on file  . Years of education: Not on file  . Highest education level: Not on file  Occupational History  . Not on file  Social Needs  . Financial resource strain: Not on file  . Food insecurity:    Worry: Not on file    Inability: Not on file  . Transportation needs:    Medical: Not on file    Non-medical: Not on file  Tobacco Use  . Smoking status: Never Smoker  . Smokeless tobacco: Never Used  Substance and  Sexual Activity  . Alcohol use: Yes    Comment: 1 a month  . Drug use: No  . Sexual activity: Not on file  Lifestyle  . Physical activity:    Days per week: Not on file    Minutes per session: Not on file  . Stress: Not on file  Relationships  . Social connections:    Talks on phone: Not on file    Gets together: Not on file    Attends religious service: Not on file    Active member of club or organization: Not on file    Attends meetings of clubs or organizations: Not on file    Relationship status: Not on file  . Intimate partner violence:    Fear of current or ex partner: Not on file    Emotionally abused: Not on file    Physically abused: Not on file    Forced sexual activity: Not on file  Other Topics Concern  . Not on file  Social History Narrative   Lives with fiance   Works in Landscape architect   No children   No pets    From [employer] [place] [employer]    Past Surgical History:  Procedure Laterality Date  . CATARACT EXTRACTION Right 07/09/2016  . EYE SURGERY Right 2004   detached retnia  . EYE SURGERY Right 2012  macular hole repair  . EYE SURGERY Left 2015   last retina repair    Family History  Problem Relation Age of Onset  . Hypertension Mother   . Breast cancer Mother 30  . Diabetes Father   . Hypertension Sister     No Known Allergies  Current Outpatient Medications on File Prior to Visit  Medication Sig Dispense Refill  . hydrochlorothiazide (HYDRODIURIL) 12.5 MG tablet Take 1 tablet (12.5 mg total) by mouth daily. 90 tablet 0  . potassium chloride SA (K-DUR,KLOR-CON) 20 MEQ tablet Take 1 tablet (20 mEq total) daily by mouth. 90 tablet 1   No current facility-administered medications on file prior to visit.     BP 137/81 (BP Location: Right Arm, Cuff Size: Large)   Pulse 96   Temp 98.4 F (36.9 C) (Oral)   Resp 16   Ht  (1.702 m)   Wt 207 lb 3.2 oz (94 kg)   LMP 04/29/2017   SpO2 100%   BMI 32.45 kg/m         Objective:   Physical Exam  Physical Exam  Constitutional: She is oriented to person, place, and time. She appears well-developed and well-nourished. No distress.  HENT:  Head: Normocephalic and atraumatic.  Right Ear: Tympanic membrane and ear canal normal.  Left Ear: Tympanic membrane and ear canal normal.  Mouth/Throat: Oropharynx is clear and moist.  Eyes: Pupils are equal, round, and reactive to light. No scleral icterus.  Neck: Normal range of motion. No thyromegaly present.  Cardiovascular: Normal rate and regular rhythm.   No murmur heard. Pulmonary/Chest: Effort normal and breath sounds normal. No respiratory distress. He has no wheezes. She has no rales. She exhibits no tenderness.  Abdominal: Soft. Bowel sounds are normal. She exhibits no distension and no mass. There is no tenderness. There is no rebound and no guarding.  Musculoskeletal: She exhibits no edema.  Lymphadenopathy:    She has no cervical adenopathy.  Neurological: She is alert and oriented to person, place, and time. She has normal patellar reflexes. She exhibits normal muscle tone. Coordination normal.  Skin: Skin is warm and dry.  Psychiatric: Tearful and anxious appearing. Judgment and thought content normal.  Breasts: Examined lying Right: Without masses, retractions, discharge or axillary adenopathy.  Left: Without masses, retractions, discharge or axillary adenopathy.  Pelvic: deferred          Assessment & Plan:   Preventative care- refer for mammogram, she will schedule pap smear. Obtain routine lab work.  Anxiety- Uncontrolled. we discussed short term use of prn xanax to help her get through her upcoming screenings and specialist appointments.  controlled substance registry is reviewed. Will also obtain UDS.      Assessment & Plan:  EKG tracing is personally reviewed.  EKG notes NSR.  No acute changes.

## 2017-05-23 NOTE — Patient Instructions (Signed)
Please schedule mammogram on the first floor. Contact GYN to schedule pap smear. Complete lab work prior to leaving.    Preventive Care 40-64 Years, Female Preventive care refers to lifestyle choices and visits with your health care provider that can promote health and wellness. What does preventive care include?  A yearly physical exam. This is also called an annual well check.  Dental exams once or twice a year.  Routine eye exams. Ask your health care provider how often you should have your eyes checked.  Personal lifestyle choices, including: ? Daily care of your teeth and gums. ? Regular physical activity. ? Eating a healthy diet. ? Avoiding tobacco and drug use. ? Limiting alcohol use. ? Practicing safe sex. ? Taking low-dose aspirin daily starting at age 8. ? Taking vitamin and mineral supplements as recommended by your health care provider. What happens during an annual well check? The services and screenings done by your health care provider during your annual well check will depend on your age, overall health, lifestyle risk factors, and family history of disease. Counseling Your health care provider may ask you questions about your:  Alcohol use.  Tobacco use.  Drug use.  Emotional well-being.  Home and relationship well-being.  Sexual activity.  Eating habits.  Work and work Statistician.  Method of birth control.  Menstrual cycle.  Pregnancy history.  Screening You may have the following tests or measurements:  Height, weight, and BMI.  Blood pressure.  Lipid and cholesterol levels. These may be checked every 5 years, or more frequently if you are over 99 years old.  Skin check.  Lung cancer screening. You may have this screening every year starting at age 15 if you have a 30-pack-year history of smoking and currently smoke or have quit within the past 15 years.  Fecal occult blood test (FOBT) of the stool. You may have this test every year  starting at age 36.  Flexible sigmoidoscopy or colonoscopy. You may have a sigmoidoscopy every 5 years or a colonoscopy every 10 years starting at age 17.  Hepatitis C blood test.  Hepatitis B blood test.  Sexually transmitted disease (STD) testing.  Diabetes screening. This is done by checking your blood sugar (glucose) after you have not eaten for a while (fasting). You may have this done every 1-3 years.  Mammogram. This may be done every 1-2 years. Talk to your health care provider about when you should start having regular mammograms. This may depend on whether you have a family history of breast cancer.  BRCA-related cancer screening. This may be done if you have a family history of breast, ovarian, tubal, or peritoneal cancers.  Pelvic exam and Pap test. This may be done every 3 years starting at age 76. Starting at age 68, this may be done every 5 years if you have a Pap test in combination with an HPV test.  Bone density scan. This is done to screen for osteoporosis. You may have this scan if you are at high risk for osteoporosis.  Discuss your test results, treatment options, and if necessary, the need for more tests with your health care provider. Vaccines Your health care provider may recommend certain vaccines, such as:  Influenza vaccine. This is recommended every year.  Tetanus, diphtheria, and acellular pertussis (Tdap, Td) vaccine. You may need a Td booster every 10 years.  Varicella vaccine. You may need this if you have not been vaccinated.  Zoster vaccine. You may need this after age  30.  Measles, mumps, and rubella (MMR) vaccine. You may need at least one dose of MMR if you were born in 1957 or later. You may also need a second dose.  Pneumococcal 13-valent conjugate (PCV13) vaccine. You may need this if you have certain conditions and were not previously vaccinated.  Pneumococcal polysaccharide (PPSV23) vaccine. You may need one or two doses if you smoke  cigarettes or if you have certain conditions.  Meningococcal vaccine. You may need this if you have certain conditions.  Hepatitis A vaccine. You may need this if you have certain conditions or if you travel or work in places where you may be exposed to hepatitis A.  Hepatitis B vaccine. You may need this if you have certain conditions or if you travel or work in places where you may be exposed to hepatitis B.  Haemophilus influenzae type b (Hib) vaccine. You may need this if you have certain conditions.  Talk to your health care provider about which screenings and vaccines you need and how often you need them. This information is not intended to replace advice given to you by your health care provider. Make sure you discuss any questions you have with your health care provider. Document Released: 01/22/2015 Document Revised: 09/15/2015 Document Reviewed: 10/27/2014 Elsevier Interactive Patient Education  [employer].

## 2017-05-24 LAB — PAIN MGMT, PROFILE 8 W/CONF, U
6 ACETYLMORPHINE: NEGATIVE ng/mL (ref <10)
Alcohol Metabolites: NEGATIVE ng/mL (ref <500)
Amphetamines: NEGATIVE ng/mL (ref <500)
Benzodiazepines: NEGATIVE ng/mL (ref <100)
Buprenorphine, Urine: NEGATIVE ng/mL (ref <5)
CREATININE: 16.6 mg/dL — AB
Cocaine Metabolite: NEGATIVE ng/mL (ref <150)
MDMA: NEGATIVE ng/mL (ref <500)
Marijuana Metabolite: NEGATIVE ng/mL (ref <20)
OPIATES: NEGATIVE ng/mL (ref <100)
OXIDANT: NEGATIVE ug/mL (ref <200)
Oxycodone: NEGATIVE ng/mL (ref <100)
Specific Gravity: 1.003 (ref >=1.0)
pH: 6.76 (ref 4.5–9.0)

## 2017-05-25 DIAGNOSIS — H26491 Other secondary cataract, right eye: Secondary | ICD-10-CM | POA: Diagnosis not present

## 2017-06-01 DIAGNOSIS — H35412 Lattice degeneration of retina, left eye: Secondary | ICD-10-CM | POA: Diagnosis not present

## 2017-06-01 DIAGNOSIS — Z8669 Personal history of other diseases of the nervous system and sense organs: Secondary | ICD-10-CM | POA: Diagnosis not present

## 2017-06-01 DIAGNOSIS — H31091 Other chorioretinal scars, right eye: Secondary | ICD-10-CM | POA: Diagnosis not present

## 2017-06-01 DIAGNOSIS — H43822 Vitreomacular adhesion, left eye: Secondary | ICD-10-CM | POA: Diagnosis not present

## 2017-07-12 ENCOUNTER — Telehealth: Payer: Self-pay | Admitting: Family

## 2017-07-12 NOTE — Telephone Encounter (Signed)
See mychart.  

## 2017-08-07 ENCOUNTER — Ambulatory Visit (INDEPENDENT_AMBULATORY_CARE_PROVIDER_SITE_OTHER): Payer: BLUE CROSS/BLUE SHIELD | Admitting: Family

## 2017-08-07 ENCOUNTER — Encounter: Payer: Self-pay | Admitting: Family

## 2017-08-07 VITALS — BP 138/85 | HR 92 | Temp 97.7°F | Resp 16 | Ht 67.0 in | Wt 204.8 lb

## 2017-08-07 DIAGNOSIS — M722 Plantar fascial fibromatosis: Secondary | ICD-10-CM | POA: Diagnosis not present

## 2017-08-07 MED ORDER — MELOXICAM 7.5 MG PO TABS: 7.5000 mg | ORAL_TABLET | Freq: Every day | ORAL | 0 refills | Status: DC

## 2017-08-07 NOTE — Patient Instructions (Signed)
Please begin exercises for your plantar fascitis. Ice your foot twice daily by rolling on a frozen water bottle. Begin meloxicam once daily. Call if new/worsening symptoms or if not improved in 2-3 weeks.

## 2017-08-07 NOTE — Progress Notes (Signed)
Subjective:    Patient ID: Evelyn Martin, female    DOB: 1970-04-12, 47 y.o.   MRN: [id number]  HPI  Patient is a 47 year old female who presents today with chief complaint of right-sided heel pain. Worst in the AM when she gets up.   Has been rolling her foot on a ball.  She has been trying to  where tennis shoes as much as possible.  She does not feel that these  measures have been helping her pain.  Review of Systems    see HPI  Past Medical History:  Diagnosis Date  . Allergy   . History of anemia 2012  . Hypertension      Social History   Socioeconomic History  . Marital status: Single    Spouse name: Not on file  . Number of children: Not on file  . Years of education: Not on file  . Highest education level: Not on file  Occupational History  . Not on file  Social Needs  . Financial resource strain: Not on file  . Food insecurity:    Worry: Not on file    Inability: Not on file  . Transportation needs:    Medical: Not on file    Non-medical: Not on file  Tobacco Use  . Smoking status: Never Smoker  . Smokeless tobacco: Never Used  Substance and Sexual Activity  . Alcohol use: Yes    Comment: 1 a month  . Drug use: No  . Sexual activity: Not on file  Lifestyle  . Physical activity:    Days per week: Not on file    Minutes per session: Not on file  . Stress: Not on file  Relationships  . Social connections:    Talks on phone: Not on file    Gets together: Not on file    Attends religious service: Not on file    Active member of club or organization: Not on file    Attends meetings of clubs or organizations: Not on file    Relationship status: Not on file  . Intimate partner violence:    Fear of current or ex partner: Not on file    Emotionally abused: Not on file    Physically abused: Not on file    Forced sexual activity: Not on file  Other Topics Concern  . Not on file  Social History Narrative   Lives with fiance   Works in Education officer, museumfurniture  industry   No children   No pets    [place]   Studying communications at [employer]    Past Surgical History:  Procedure Laterality Date  . CATARACT EXTRACTION Right 07/09/2016  . EYE SURGERY Right 2004   detached retnia  . EYE SURGERY Right 2012   macular hole repair  . EYE SURGERY Left 2015   last retina repair    Family History  Problem Relation Age of Onset  . Hypertension Mother   . Breast cancer Mother [id number]  . Diabetes Father   . Hypertension Sister     No Known Allergies  Current Outpatient Medications on File Prior to Visit  Medication Sig Dispense Refill  . ALPRAZolam (XANAX) 0.25 MG tablet Take 1 tablet (0.25 mg total) by mouth daily as needed for anxiety. 20 tablet 0  . hydrochlorothiazide (HYDRODIURIL) 12.5 MG tablet Take 1 tablet (12.5 mg total) by mouth daily. 90 tablet 1  . potassium chloride SA (K-DUR,KLOR-CON) 20 MEQ tablet Take 1 tablet (20 mEq total) by  mouth daily. 90 tablet 1  . [DISCONTINUED] albuterol (PROVENTIL HFA;VENTOLIN HFA) 108 (90 Base) MCG/ACT inhaler Inhale 1-2 puffs into the lungs every 6 (six) hours as needed for wheezing or shortness of breath. (Patient not taking: Reported on 05/23/2017) 1 Inhaler 0   No current facility-administered medications on file prior to visit.     BP 138/85 (BP Location: Left Arm, Patient Position: Sitting, Cuff Size: Small)   Pulse 92   Temp 97.7 F (36.5 C) (Oral)   Resp 16   Ht 5\' 7"  (1.702 m)   Wt 204 lb 12.8 oz (92.9 kg)   LMP 07/24/2017   SpO2 100%   BMI 32.08 kg/m    Objective:   Physical Exam  Constitutional: She is oriented to person, place, and time. She appears well-developed and well-nourished. No distress.  Musculoskeletal: She exhibits no edema.   positive tenderness to palpation right heel and lower arch  Neurological: She is alert and oriented to person, place, and time.  Skin: Skin is warm and dry.          Assessment & Plan:  Plantar fasciitis- we discussed initiation of an  anti-inflammatory.  We will begin meloxicam.  We discussed icing her foot twice daily by rolling on a frozen water bottle.  We also discussed stretching and exercises and she was given a handout from up-to-date on these exercises.  She was also given patient education handout from up-to-date."plantar fasciitis: Beyond the basics."   She is advised to wear good supportive shoes.  She is also advised to call if symptoms worsen or if they are not improved in 2 to 3 weeks.  Could consider referral to podiatry at that time.

## 2017-08-10 DIAGNOSIS — H35412 Lattice degeneration of retina, left eye: Secondary | ICD-10-CM | POA: Diagnosis not present

## 2017-08-10 DIAGNOSIS — H33322 Round hole, left eye: Secondary | ICD-10-CM | POA: Diagnosis not present

## 2017-08-10 DIAGNOSIS — H43812 Vitreous degeneration, left eye: Secondary | ICD-10-CM | POA: Diagnosis not present

## 2017-08-10 DIAGNOSIS — H43392 Other vitreous opacities, left eye: Secondary | ICD-10-CM | POA: Diagnosis not present

## 2017-09-05 ENCOUNTER — Encounter: Payer: Self-pay | Admitting: Family

## 2017-09-05 ENCOUNTER — Ambulatory Visit (INDEPENDENT_AMBULATORY_CARE_PROVIDER_SITE_OTHER): Payer: BLUE CROSS/BLUE SHIELD | Admitting: Family

## 2017-09-05 VITALS — BP 122/86 | HR 85 | Temp 98.3°F | Resp 16 | Ht 67.0 in | Wt 203.2 lb

## 2017-09-05 DIAGNOSIS — I10 Essential (primary) hypertension: Secondary | ICD-10-CM | POA: Diagnosis not present

## 2017-09-05 DIAGNOSIS — F419 Anxiety disorder, unspecified: Secondary | ICD-10-CM

## 2017-09-05 DIAGNOSIS — M722 Plantar fascial fibromatosis: Secondary | ICD-10-CM

## 2017-09-05 LAB — BASIC METABOLIC PANEL
BUN: 14 mg/dL (ref 6–23)
CALCIUM: 9.5 mg/dL (ref 8.4–10.5)
CO2: 26 meq/L (ref 19–32)
CREATININE: 0.96 mg/dL (ref 0.40–1.20)
Chloride: 102 mEq/L (ref 96–112)
GFR: 66.16 mL/min (ref >60.00)
Glucose, Bld: 110 mg/dL — ABNORMAL HIGH (ref 70–99)
Potassium: 3.8 mEq/L (ref 3.5–5.1)
SODIUM: 138 meq/L (ref 135–145)

## 2017-09-05 NOTE — Progress Notes (Signed)
Subjective:    Patient ID: Evelyn Martin, female    DOB: [birth year], 47 y.o.   MRN: [id number]  HPI   Patient is a 47 yr old female who presents today for follow up.  HTN- She continues hctz and kdur.   BP Readings from Last 3 Encounters:  09/05/17 122/86  08/07/17 138/85  05/23/17 137/81   Plantar fasciitis-  Last month we discussed stretching exercises, icing, and prescribed mobic. Reports that she has ben wearing tennis shoes.   Overall improved.  40-50% better.    Anxiety- has rx for prn xanax. Uses before she has doctor's appointments.  Wt Readings from Last 3 Encounters:  09/05/17 203 lb 3.2 oz (92.2 kg)  08/07/17 204 lb 12.8 oz (92.9 kg)  05/23/17 207 lb 3.2 oz (94 kg)     Review of Systems See HPI  Past Medical History:  Diagnosis Date  . Allergy   . History of anemia 2012  . Hypertension      Social History   Socioeconomic History  . Marital status: Single    Spouse name: Not on file  . Number of children: Not on file  . Years of education: Not on file  . Highest education level: Not on file  Occupational History  . Not on file  Social Needs  . Financial resource strain: Not on file  . Food insecurity:    Worry: Not on file    Inability: Not on file  . Transportation needs:    Medical: Not on file    Non-medical: Not on file  Tobacco Use  . Smoking status: Never Smoker  . Smokeless tobacco: Never Used  Substance and Sexual Activity  . Alcohol use: Yes    Comment: 1 a month  . Drug use: No  . Sexual activity: Not on file  Lifestyle  . Physical activity:    Days per week: Not on file    Minutes per session: Not on file  . Stress: Not on file  Relationships  . Social connections:    Talks on phone: Not on file    Gets together: Not on file    Attends religious service: Not on file    Active member of club or organization: Not on file    Attends meetings of clubs or organizations: Not on file    Relationship status: Not on file  .  Intimate partner violence:    Fear of current or ex partner: Not on file    Emotionally abused: Not on file    Physically abused: Not on file    Forced sexual activity: Not on file  Other Topics Concern  . Not on file  Social History Narrative   Lives with fiance   Works in Landscape architect   No children   No pets    From [employer]    Past Surgical History:  Procedure Laterality Date  . CATARACT EXTRACTION Right 07/09/2016  . EYE SURGERY Right 2004   detached retnia  . EYE SURGERY Right 2012   macular hole repair  . EYE SURGERY Left 2015   last retina repair    Family History  Problem Relation Age of Onset  . Hypertension Mother   . Breast cancer Mother 59  . Diabetes Father   . Hypertension Sister     No Known Allergies  Current Outpatient Medications on File Prior to Visit  Medication Sig Dispense Refill  . ALPRAZolam (XANAX) 0.25  MG tablet Take 1 tablet (0.25 mg total) by mouth daily as needed for anxiety. 20 tablet 0  . hydrochlorothiazide (HYDRODIURIL) 12.5 MG tablet Take 1 tablet (12.5 mg total) by mouth daily. 90 tablet 1  . potassium chloride SA (K-DUR,KLOR-CON) 20 MEQ tablet Take 1 tablet (20 mEq total) by mouth daily. 90 tablet 1  . [DISCONTINUED] albuterol (PROVENTIL HFA;VENTOLIN HFA) 108 (90 Base) MCG/ACT inhaler Inhale 1-2 puffs into the lungs every 6 (six) hours as needed for wheezing or shortness of breath. (Patient not taking: Reported on 05/23/2017) 1 Inhaler 0   No current facility-administered medications on file prior to visit.     BP 122/86 (BP Location: Left Arm, Cuff Size: Large)   Pulse 85   Temp 98.3 F (36.8 C) (Oral)   Resp 16   Ht 5\' 7"  (1.702 m)   Wt 203 lb 3.2 oz (92.2 kg)   LMP 08/15/2017   SpO2 100%   BMI 31.83 kg/m       Objective:   Physical Exam  Constitutional: She is oriented to person, place, and time. She appears well-developed and well-nourished.  Cardiovascular: Normal rate, regular  rhythm and normal heart sounds.  No murmur heard. Pulmonary/Chest: Effort normal and breath sounds normal. No respiratory distress. She has no wheezes.  Neurological: She is alert and oriented to person, place, and time.  Skin: Skin is warm and dry.  Psychiatric: She has a normal mood and affect. Her behavior is normal. Judgment and thought content normal.          Assessment & Plan:  HTN- bp stable, continue current meds. Obtain follow up bmet. She notes she has missed some kdur doses recently.  Plantar fasciitis- improving, she is not icing or doing exercises regularly. Encouraged her to work on these things and continue prn meloxicam.  Anxiety- situational with dr's appointments. Continue prn alprazolam.

## 2017-09-05 NOTE — Patient Instructions (Addendum)
Please call Dr Erin FullingHarraway-Smith at [phone] to schedule your pap smear soon. Complete lab work prior to leaving.

## 2017-12-04 DIAGNOSIS — H33322 Round hole, left eye: Secondary | ICD-10-CM | POA: Diagnosis not present

## 2017-12-04 DIAGNOSIS — H43392 Other vitreous opacities, left eye: Secondary | ICD-10-CM | POA: Diagnosis not present

## 2017-12-04 DIAGNOSIS — H43812 Vitreous degeneration, left eye: Secondary | ICD-10-CM | POA: Diagnosis not present

## 2017-12-04 DIAGNOSIS — H2512 Age-related nuclear cataract, left eye: Secondary | ICD-10-CM | POA: Diagnosis not present

## 2017-12-25 ENCOUNTER — Ambulatory Visit (INDEPENDENT_AMBULATORY_CARE_PROVIDER_SITE_OTHER): Payer: BLUE CROSS/BLUE SHIELD | Admitting: Medical

## 2017-12-25 ENCOUNTER — Encounter: Payer: Self-pay | Admitting: Medical

## 2017-12-25 VITALS — BP 149/87 | HR 99 | Temp 98.4°F | Resp 16 | Ht 67.0 in | Wt 199.1 lb

## 2017-12-25 DIAGNOSIS — J309 Allergic rhinitis, unspecified: Secondary | ICD-10-CM | POA: Diagnosis not present

## 2017-12-25 DIAGNOSIS — H938X1 Other specified disorders of right ear: Secondary | ICD-10-CM | POA: Diagnosis not present

## 2017-12-25 DIAGNOSIS — H33322 Round hole, left eye: Secondary | ICD-10-CM | POA: Diagnosis not present

## 2017-12-25 MED ORDER — FLUTICASONE PROPIONATE 50 MCG/ACT NA SUSP: 2.0000 | Freq: Every day | NASAL | 1 refills | Status: DC

## 2017-12-25 MED ORDER — FLUTICASONE PROPIONATE 50 MCG/ACT NA SUSP
2.0000 | Freq: Every day | NASAL | 1 refills | Status: DC
Start: 2017-12-25 — End: 2018-01-24

## 2017-12-25 MED ORDER — AMOXICILLIN-POT CLAVULANATE 875-125 MG PO TABS: 1.0000 | ORAL_TABLET | Freq: Two times a day (BID) | ORAL | 0 refills | Status: DC

## 2017-12-25 MED FILL — AMOX-CLAV 875-125 MG TABLET: 875-125 | 10 days supply | Qty: 20 | Fill #0

## 2017-12-25 MED FILL — SM ALLERGY RELIEF 50 MCG SP: 50 MCG | 29 days supply | Qty: 16 | Fill #0

## 2017-12-25 NOTE — Progress Notes (Signed)
Subjective:    Patient ID: Evelyn Martin, female    DOB: 02-17-1970, 47 y.o.   MRN: [id number]  HPI  Pt in with some rt ear pressure/discomfort x 1 wk. Ear feels plugged. She is about to fly out of town this weekend. She will fly to [place]. She has connection flight. Pt states hx of occasional ear infection. One ear infection past 5 years.  Pt states she stopped zyrtec just one week. Usually has seasonal allergies. Ear pressure started around time zyrtec stopped.  Pt has used flonase in past but not recently.   Review of Systems  Constitutional: Negative for chills, fatigue and fever.  HENT: Positive for congestion and ear pain. Negative for postnasal drip, sinus pressure, sinus pain and sneezing.   Respiratory: Negative for cough, chest tightness, shortness of breath and wheezing.   Cardiovascular: Negative for chest pain and palpitations.  Gastrointestinal: Negative for abdominal pain.  Musculoskeletal: Negative for back pain.  Skin: Negative for rash.  Hematological: Negative for adenopathy. Does not bruise/bleed easily.  Psychiatric/Behavioral: Negative for behavioral problems and confusion. The patient is not nervous/anxious.     Past Medical History:  Diagnosis Date  . Allergy   . History of anemia 2012  . Hypertension      Social History   Socioeconomic History  . Marital status: Single    Spouse name: Not on file  . Number of children: Not on file  . Years of education: Not on file  . Highest education level: Not on file  Occupational History  . Not on file  Social Needs  . Financial resource strain: Not on file  . Food insecurity:    Worry: Not on file    Inability: Not on file  . Transportation needs:    Medical: Not on file    Non-medical: Not on file  Tobacco Use  . Smoking status: Never Smoker  . Smokeless tobacco: Never Used  Substance and Sexual Activity  . Alcohol use: Yes    Comment: 1 a month  . Drug use: No  . Sexual activity: Not on file   Lifestyle  . Physical activity:    Days per week: Not on file    Minutes per session: Not on file  . Stress: Not on file  Relationships  . Social connections:    Talks on phone: Not on file    Gets together: Not on file    Attends religious service: Not on file    Active member of club or organization: Not on file    Attends meetings of clubs or organizations: Not on file    Relationship status: Not on file  . Intimate partner violence:    Fear of current or ex partner: Not on file    Emotionally abused: Not on file    Physically abused: Not on file    Forced sexual activity: Not on file  Other Topics Concern  . Not on file  Social History Narrative   Lives with fiance   Works in Landscape architect   No children   No pets    From [employer] at [employer]    Past Surgical History:  Procedure Laterality Date  . CATARACT EXTRACTION Right 07/09/2016  . EYE SURGERY Right 2004   detached retnia  . EYE SURGERY Right 2012   macular hole repair  . EYE SURGERY Left 2015   last retina repair    Family History  Problem Relation Age of  Onset  . Hypertension Mother   . Breast cancer Mother 2255  . Diabetes Father   . Hypertension Sister     No Known Allergies  Current Outpatient Medications on File Prior to Visit  Medication Sig Dispense Refill  . ALPRAZolam (XANAX) 0.25 MG tablet Take 1 tablet (0.25 mg total) by mouth daily as needed for anxiety. 20 tablet 0  . hydrochlorothiazide (HYDRODIURIL) 12.5 MG tablet Take 1 tablet (12.5 mg total) by mouth daily. 90 tablet 1  . potassium chloride SA (K-DUR,KLOR-CON) 20 MEQ tablet Take 1 tablet (20 mEq total) by mouth daily. 90 tablet 1  . [DISCONTINUED] albuterol (PROVENTIL HFA;VENTOLIN HFA) 108 (90 Base) MCG/ACT inhaler Inhale 1-2 puffs into the lungs every 6 (six) hours as needed for wheezing or shortness of breath. (Patient not taking: Reported on 05/23/2017) 1 Inhaler 0   No current facility-administered  medications on file prior to visit.     BP (!) 149/87 (BP Location: Left Arm, Patient Position: Sitting, Cuff Size: Small)   Pulse 99   Temp 98.4 F (36.9 C) (Oral)   Resp 16   Ht 5\' 7"  (1.702 m)   Wt 199 lb 2 oz (90.3 kg)   LMP 12/11/2017 (Approximate)   SpO2 100%   BMI 31.19 kg/m       Objective:   Physical Exam  General  Mental Status - Alert. General Appearance - Well groomed. Not in acute distress.  Skin Rashes- No Rashes.  HEENT Head- Normal. Ear Auditory Canal - Left- Normal. Right - Normal.Tympanic Membrane- Left- Normal. Right- mld faint dull rt tm. Eye Sclera/Conjunctiva- Left- Normal. Right- Normal. Nose & Sinuses Nasal Mucosa- Left-  Boggy and Congested. Right-  Boggy and  Congested.Bilateral  No maxillary and frontal sinus pressure. Mouth & Throat Lips: Upper Lip- Normal: no dryness, cracking, pallor, cyanosis, or vesicular eruption. Lower Lip-Normal: no dryness, cracking, pallor, cyanosis or vesicular eruption. Buccal Mucosa- Bilateral- No Aphthous ulcers. Oropharynx- No Discharge or Erythema. Tonsils: Characteristics- Bilateral- No Erythema or Congestion. Size/Enlargement- Bilateral- No enlargement. Discharge- bilateral-None.  Neck Neck- Supple. No Masses.   Chest and Lung Exam Auscultation: Breath Sounds:-Clear even and unlabored.  Cardiovascular Auscultation:Rythm- Regular, rate and rhythm. Murmurs & Other Heart Sounds:Ausculatation of the heart reveal- No Murmurs.  Lymphatic Head & Neck General Head & Neck Lymphatics: Bilateral: Description- No Localized lymphadenopathy.      Assessment & Plan:  You do have history of recent right ear pressure and a history of seasonal allergies.  Pressure presently might be eustachian tube pressure.  No obvious infection a this time.  Some concern with upcoming holidays and your trip to [place].  I do not want you to have a ear infection without antibiotic available.  So rx augmentin sent to your pharmacy  to use if needed.  Presently recommend that you get back on Zyrtec and start to use Flonase daily.  If you do have some ear pressure right before your flight you could use some Afrin preflight.  Follow-up in 10 to 14 days or as needed.  Esperanza RichtersEdward Deovion Batrez, PA-C

## 2017-12-25 NOTE — Patient Instructions (Addendum)
You do have history of recent right ear pressure and a history of seasonal allergies.  Pressure presently might be eustachian tube pressure.  No obvious infection a this time.  Some concern with upcoming holidays and your trip to [place].  I do not want you to have a ear infection without antibiotic available.  So rx augmentin sent to your pharmacy to use if needed.  Presently recommend that you get back on Zyrtec and start to use Flonase daily.  If you do have some ear pressure right before your flight you could use some Afrin preflight.  Follow-up in 10 to 14 days or as needed.

## 2017-12-25 NOTE — Progress Notes (Signed)
Pre visit review using our clinic review tool, if applicable. No additional management support is needed unless otherwise documented below in the visit note. 

## 2018-01-24 ENCOUNTER — Encounter: Payer: Self-pay | Admitting: Family Medicine

## 2018-01-24 ENCOUNTER — Ambulatory Visit (INDEPENDENT_AMBULATORY_CARE_PROVIDER_SITE_OTHER): Payer: BLUE CROSS/BLUE SHIELD | Admitting: Family Medicine

## 2018-01-24 VITALS — BP 124/80 | HR 109 | Temp 98.8°F | Ht 67.0 in | Wt 199.4 lb

## 2018-01-24 DIAGNOSIS — J0140 Acute pansinusitis, unspecified: Secondary | ICD-10-CM

## 2018-01-24 MED ORDER — DOXYCYCLINE HYCLATE 100 MG PO TABS: 100.0000 mg | ORAL_TABLET | Freq: Two times a day (BID) | ORAL | 0 refills | Status: AC

## 2018-01-24 MED ORDER — METHYLPREDNISOLONE ACETATE 80 MG/ML IJ SUSP
80.0000 mg | Freq: Once | INTRAMUSCULAR | Status: AC
  Administered 2018-01-24: 80 mg via INTRAMUSCULAR

## 2018-01-24 MED ORDER — BENZONATATE 100 MG PO CAPS: 100.0000 mg | ORAL_CAPSULE | Freq: Three times a day (TID) | ORAL | 0 refills | Status: DC | PRN

## 2018-01-24 MED FILL — DOXYCYCLINE HYCLATE 100 MG: 100 | 7 days supply | Qty: 14 | Fill #0

## 2018-01-24 MED FILL — BENZONATATE 100 MG CAP: 100 | 9 days supply | Qty: 28 | Fill #0

## 2018-01-24 NOTE — Patient Instructions (Signed)
Continue to push fluids, practice good hand hygiene, and cover your mouth if you cough.  If you start having fevers, shaking or shortness of breath, seek immediate care.  For symptoms, consider using Vick's VapoRub on chest or under nose, air humidifier, Benadryl at night, and elevating the head of the bed. Tylenol and ibuprofen for aches and pains you may be experiencing.   Let us know if you need anything.  

## 2018-01-24 NOTE — Addendum Note (Signed)
Addended by: Scharlene Gloss B on: 01/24/2018 10:23 AM   Modules accepted: Orders

## 2018-01-24 NOTE — Progress Notes (Signed)
Pre visit review using our clinic review tool, if applicable. No additional management support is needed unless otherwise documented below in the visit note. 

## 2018-01-24 NOTE — Progress Notes (Signed)
Chief Complaint  Patient presents with  . Sinusitis    Guss Bunde here for URI complaints.  Duration: 8 days  Associated symptoms: sinus congestion, dental pain, sinus pain, rhinorrhea, sore throat and cough Denies: itchy watery eyes, wheezing, fevers, shortness of breath and myalgia Treatment to date: Cloracetin, Zyrtec, Flonase, Alka-Seltzer Sick contacts: Yes  ROS:  HEENT: As noted in HPI Lungs: No SOB  Past Medical History:  Diagnosis Date  . Allergy   . History of anemia 2012  . Hypertension     BP 124/80 (BP Location: Left Arm, Patient Position: Sitting, Cuff Size: Normal)   Pulse (!) 109   Temp 98.8 F (37.1 C) (Oral)   Ht 5\' 7"  (1.702 m)   Wt 199 lb 6 oz (90.4 kg)   SpO2 99%   BMI 31.23 kg/m  General: Awake, alert, appears stated age HEENT: AT, [place], ears patent b/l and TM's neg, nares patent w clear discharge, +TTP over max/frontal sinuses b/l, pharynx pink and without exudates, MMM Neck: No masses or asymmetry Heart: RRR Lungs: CTAB, no accessory muscle use Psych: Age appropriate judgment and insight, normal mood and affect  Acute pansinusitis, recurrence not specified - Plan: doxycycline (VIBRA-TABS) 100 MG tablet, benzonatate (TESSALON) 100 MG capsule  Orders as above. Steroid inj today. Continue to push fluids, practice good hand hygiene, cover mouth when coughing. F/u prn. If starting to experience fevers, shaking, or shortness of breath, seek immediate care. Pt voiced understanding and agreement to the plan.  Evelyn Martin [place], DO 01/24/18 10:14 AM

## 2018-02-11 ENCOUNTER — Encounter: Payer: Self-pay | Admitting: Family

## 2018-02-12 ENCOUNTER — Ambulatory Visit (INDEPENDENT_AMBULATORY_CARE_PROVIDER_SITE_OTHER): Payer: BLUE CROSS/BLUE SHIELD | Admitting: Medical

## 2018-02-12 ENCOUNTER — Encounter: Payer: Self-pay | Admitting: Medical

## 2018-02-12 VITALS — BP 142/88 | Ht 67.0 in | Wt 202.0 lb

## 2018-02-12 DIAGNOSIS — N390 Urinary tract infection, site not specified: Secondary | ICD-10-CM | POA: Diagnosis not present

## 2018-02-12 DIAGNOSIS — M5432 Sciatica, left side: Secondary | ICD-10-CM

## 2018-02-12 DIAGNOSIS — R350 Frequency of micturition: Secondary | ICD-10-CM

## 2018-02-12 DIAGNOSIS — R3915 Urgency of urination: Secondary | ICD-10-CM | POA: Diagnosis not present

## 2018-02-12 LAB — POCT URINALYSIS DIPSTICK
Bilirubin, UA: NEGATIVE
Blood, UA: NEGATIVE
Glucose, UA: NEGATIVE
Ketones, UA: NEGATIVE
LEUKOCYTES UA: NEGATIVE
Nitrite, UA: NEGATIVE
Protein, UA: NEGATIVE
Spec Grav, UA: 1.01 (ref 1.010–1.025)
Urobilinogen, UA: 0.2 E.U./dL
pH, UA: 6 (ref 5.0–8.0)

## 2018-02-12 MED ORDER — KETOROLAC TROMETHAMINE 30 MG/ML IJ SOLN
30.0000 mg | Freq: Once | INTRAMUSCULAR | Status: AC
  Administered 2018-02-12: 30 mg via INTRAMUSCULAR

## 2018-02-12 MED ORDER — NITROFURANTOIN MONOHYD MACRO 100 MG PO CAPS: 100.0000 mg | ORAL_CAPSULE | Freq: Two times a day (BID) | ORAL | 0 refills | Status: DC

## 2018-02-12 MED FILL — NITROFURANTOIN MONO-MCR 100: 100 | 7 days supply | Qty: 14 | Fill #0

## 2018-02-12 NOTE — Progress Notes (Signed)
Subjective:    Patient ID: Evelyn Martin, female    DOB: [birth year], 48 y.o.   MRN: [id number]  HPI   Pt in today reporting urinary symptoms x 1 day  Dysuria- yes, burning(urgency yesterday) Frequent urination-yes Hesitancy-no Suprapubic pressure-yes Fever-no chills-no Nausea-no Vomiting-no CVA pain- History of UTI-yes Gross hematuria-no. Pt states on odor. Took azo yesterday.  Pt also mentions some left si area pain that came on yesterday evening. Little pain that radiates to toward hip and little to her left buttox.   Pt pain level in left si area 7/10.  Some stress associated to work.  On review pt notes no rash on her lower back or buttox. No vesicles reported.    Review of Systems  Constitutional: Negative for chills, fatigue and fever.  Respiratory: Negative for chest tightness, shortness of breath and wheezing.   Cardiovascular: Negative for chest pain and palpitations.  Gastrointestinal: Negative for abdominal pain.  Genitourinary: Positive for dysuria, frequency and urgency. Negative for difficulty urinating, hematuria and pelvic pain.  Musculoskeletal: Positive for back pain. Negative for arthralgias, gait problem, myalgias, neck pain and neck stiffness.  Skin: Negative for rash.  Hematological: Negative for adenopathy. Does not bruise/bleed easily.  Psychiatric/Behavioral: Negative for behavioral problems, confusion and suicidal ideas. The patient is nervous/anxious.    Past Medical History:  Diagnosis Date  . Allergy   . History of anemia 2012  . Hypertension      Social History   Socioeconomic History  . Marital status: Single    Spouse name: Not on file  . Number of children: Not on file  . Years of education: Not on file  . Highest education level: Not on file  Occupational History  . Not on file  Social Needs  . Financial resource strain: Not on file  . Food insecurity:    Worry: Not on file    Inability: Not on file  .  Transportation needs:    Medical: Not on file    Non-medical: Not on file  Tobacco Use  . Smoking status: Never Smoker  . Smokeless tobacco: Never Used  Substance and Sexual Activity  . Alcohol use: Yes    Comment: 1 a month  . Drug use: No  . Sexual activity: Not on file  Lifestyle  . Physical activity:    Days per week: Not on file    Minutes per session: Not on file  . Stress: Not on file  Relationships  . Social connections:    Talks on phone: Not on file    Gets together: Not on file    Attends religious service: Not on file    Active member of club or organization: Not on file    Attends meetings of clubs or organizations: Not on file    Relationship status: Not on file  . Intimate partner violence:    Fear of current or ex partner: Not on file    Emotionally abused: Not on file    Physically abused: Not on file    Forced sexual activity: Not on file  Other Topics Concern  . Not on file  Social History Narrative   Lives with fiance   Works in Landscape architect   No children   No pets    From [employer]    Past Surgical History:  Procedure Laterality Date  . CATARACT EXTRACTION Right 07/09/2016  . EYE SURGERY Right 2004   detached retnia  .  EYE SURGERY Right 2012   macular hole repair  . EYE SURGERY Left 2015   last retina repair    Family History  Problem Relation Age of Onset  . Hypertension Mother   . Breast cancer Mother [id number]  . Diabetes Father   . Hypertension Sister     No Known Allergies  Current Outpatient Medications on File Prior to Visit  Medication Sig Dispense Refill  . benzonatate (TESSALON) 100 MG capsule Take 1 capsule (100 mg total) by mouth 3 (three) times daily as needed. 28 capsule 0  . fluticasone (FLONASE) 50 MCG/ACT nasal spray Place 2 sprays into both nostrils daily. 16 g 1  . hydrochlorothiazide (HYDRODIURIL) 12.5 MG tablet Take 1 tablet (12.5 mg total) by mouth daily. 90 tablet 1  .  [DISCONTINUED] albuterol (PROVENTIL HFA;VENTOLIN HFA) 108 (90 Base) MCG/ACT inhaler Inhale 1-2 puffs into the lungs every 6 (six) hours as needed for wheezing or shortness of breath. (Patient not taking: Reported on 05/23/2017) 1 Inhaler 0   No current facility-administered medications on file prior to visit.     Ht 5\' 7"  (1.702 m)   Wt 202 lb (91.6 kg)   BMI 31.64 kg/m       Objective:   Physical Exam   General Appearance- Not in acute distress.   Cardiovascular Auscultation:Rythm - Regular. Heart Sounds -Normal heart sounds.  Abdomen Inspection:-Inspection Normal.  Palpation/Perucssion: Palpation and Percussion of the abdomen reveal- faint suprapbic tender, No Rebound tenderness, No rigidity(Guarding) and No Palpable abdominal masses.  Liver:-Normal.  Spleen:- Normal.  Suprapubic tenderness.     Back No mid lumbar spine tenderness to palpation. Pain on straight leg lift. Pain on lateral movements and flexion/extension of the spine. Left si are tender to palpation directly. Faint bilateral cva tenderness.  Lower ext- l5-s1 sensation intact. Normal reflexes        Assessment & Plan:  Your appear to have a urinary tract infection. I am prescribing macrobid antibiotic for the probable infection. Hydrate well. I am sending out a urine culture. During the interim if your signs and symptoms worsen rather than improving please notify us. We will notify your when the culture results are back. Can continue azostandard.  You also appear to have left sciatic area of pain.  After discussion regarding Toradol benefits versus risk, we did give you Toradol 30 mg IM injection.  Starting tomorrow morning around this time you can use a low dose ibuprofen 200 mg every 8 hours for the next 4 to 5 days.  You can titrate up on the dosing if needed.   If left sciatica region pain persists discussed that prednisone is an option.  Also if pain were to persist could get lumbar spine  x-ray.  Can do back stretching exercises as well.  Follow-up in 10 days or as needed.  Esperanza RichtersEdward Harshaan Whang, PA-C

## 2018-02-12 NOTE — Patient Instructions (Signed)
Your appear to have a urinary tract infection. I am prescribing macrobid antibiotic for the probable infection. Hydrate well. I am sending out a urine culture. During the interim if your signs and symptoms worsen rather than improving please notify us. We will notify your when the culture results are back. Can continue azostandard.  You also appear to have left sciatic area of pain.  After discussion regarding Toradol benefits versus risk, we did give you Toradol 30 mg IM injection.  Starting tomorrow morning around this time you can use a low dose ibuprofen 200 mg every 8 hours for the next 4 to 5 days.  You can titrate up on the dosing if needed.   If left sciatica region pain persists discussed that prednisone is an option.  Also if pain were to persist could get lumbar spine x-ray.  Can do back stretching exercises as well.  Follow-up in 10 days or as needed.   Back Exercises If you have pain in your back, do these exercises 2-3 times each day or as told by your doctor. When the pain goes away, do the exercises once each day, but repeat the steps more times for each exercise (do more repetitions). If you do not have pain in your back, do these exercises once each day or as told by your doctor. Exercises Single Knee to Chest Do these steps 3-5 times in a row for each leg: 1. Lie on your back on a firm bed or the floor with your legs stretched out. 2. Bring one knee to your chest. 3. Hold your knee to your chest by grabbing your knee or thigh. 4. Pull on your knee until you feel a gentle stretch in your lower back. 5. Keep doing the stretch for 10-30 seconds. 6. Slowly let go of your leg and straighten it. Pelvic Tilt Do these steps 5-10 times in a row: 1. Lie on your back on a firm bed or the floor with your legs stretched out. 2. Bend your knees so they point up to the ceiling. Your feet should be flat on the floor. 3. Tighten your lower belly (abdomen) muscles to press your lower  back against the floor. This will make your tailbone point up to the ceiling instead of pointing down to your feet or the floor. 4. Stay in this position for 5-10 seconds while you gently tighten your muscles and breathe evenly. Cat-Cow Do these steps until your lower back bends more easily: 1. Get on your hands and knees on a firm surface. Keep your hands under your shoulders, and keep your knees under your hips. You may put padding under your knees. 2. Let your head hang down, and make your tailbone point down to the floor so your lower back is round like the back of a cat. 3. Stay in this position for 5 seconds. 4. Slowly lift your head and make your tailbone point up to the ceiling so your back hangs low (sags) like the back of a cow. 5. Stay in this position for 5 seconds.  Press-Ups Do these steps 5-10 times in a row: 1. Lie on your belly (face-down) on the floor. 2. Place your hands near your head, about shoulder-width apart. 3. While you keep your back relaxed and keep your hips on the floor, slowly straighten your arms to raise the top half of your body and lift your shoulders. Do not use your back muscles. To make yourself more comfortable, you may change where you place  your hands. 4. Stay in this position for 5 seconds. 5. Slowly return to lying flat on the floor.  Bridges Do these steps 10 times in a row: 1. Lie on your back on a firm surface. 2. Bend your knees so they point up to the ceiling. Your feet should be flat on the floor. 3. Tighten your butt muscles and lift your butt off of the floor until your waist is almost as high as your knees. If you do not feel the muscles working in your butt and the back of your thighs, slide your feet 1-2 inches farther away from your butt. 4. Stay in this position for 3-5 seconds. 5. Slowly lower your butt to the floor, and let your butt muscles relax. If this exercise is too easy, try doing it with your arms crossed over your  chest. Belly Crunches Do these steps 5-10 times in a row: 1. Lie on your back on a firm bed or the floor with your legs stretched out. 2. Bend your knees so they point up to the ceiling. Your feet should be flat on the floor. 3. Cross your arms over your chest. 4. Tip your chin a little bit toward your chest but do not bend your neck. 5. Tighten your belly muscles and slowly raise your chest just enough to lift your shoulder blades a tiny bit off of the floor. 6. Slowly lower your chest and your head to the floor. Back Lifts Do these steps 5-10 times in a row: 1. Lie on your belly (face-down) with your arms at your sides, and rest your forehead on the floor. 2. Tighten the muscles in your legs and your butt. 3. Slowly lift your chest off of the floor while you keep your hips on the floor. Keep the back of your head in line with the curve in your back. Look at the floor while you do this. 4. Stay in this position for 3-5 seconds. 5. Slowly lower your chest and your face to the floor. Contact a doctor if:  Your back pain gets a lot worse when you do an exercise.  Your back pain does not lessen 2 hours after you exercise. If you have any of these problems, stop doing the exercises. Do not do them again unless your doctor says it is okay. Get help right away if:  You have sudden, very bad back pain. If this happens, stop doing the exercises. Do not do them again unless your doctor says it is okay. This information is not intended to replace advice given to you by your health care provider. Make sure you discuss any questions you have with your health care provider. Document Released: 01/28/2010 Document Revised: 09/19/2017 Document Reviewed: 02/19/2014 Elsevier Interactive Patient Education  2019 [employer]   Follow up in 7 days or as needed.

## 2018-02-13 ENCOUNTER — Other Ambulatory Visit: Payer: Self-pay | Admitting: Family

## 2018-02-13 ENCOUNTER — Encounter: Payer: Self-pay | Admitting: Medical

## 2018-02-13 LAB — URINE CULTURE
MICRO NUMBER:: 147852
Result:: NO GROWTH
SPECIMEN QUALITY:: ADEQUATE

## 2018-02-23 ENCOUNTER — Other Ambulatory Visit: Payer: Self-pay | Admitting: Family

## 2018-03-13 ENCOUNTER — Ambulatory Visit: Payer: BLUE CROSS/BLUE SHIELD | Admitting: Family

## 2018-05-15 ENCOUNTER — Other Ambulatory Visit: Payer: Self-pay

## 2018-05-15 ENCOUNTER — Telehealth (INDEPENDENT_AMBULATORY_CARE_PROVIDER_SITE_OTHER): Payer: BLUE CROSS/BLUE SHIELD | Admitting: Family

## 2018-05-15 DIAGNOSIS — I10 Essential (primary) hypertension: Secondary | ICD-10-CM | POA: Diagnosis not present

## 2018-05-15 DIAGNOSIS — F419 Anxiety disorder, unspecified: Secondary | ICD-10-CM

## 2018-05-15 MED ORDER — HYDROCHLOROTHIAZIDE 12.5 MG PO TABS: 12.5000 mg | ORAL_TABLET | Freq: Every day | ORAL | 1 refills | Status: DC

## 2018-05-15 MED ORDER — POTASSIUM CHLORIDE CRYS ER 20 MEQ PO TBCR: 20.0000 meq | EXTENDED_RELEASE_TABLET | Freq: Every day | ORAL | 1 refills | Status: DC

## 2018-05-15 MED ORDER — FLUOXETINE HCL 10 MG PO TABS: ORAL_TABLET | ORAL | 0 refills | Status: DC

## 2018-05-15 NOTE — Progress Notes (Signed)
Virtual Visit via Video Note  I connected with@ on 05/15/18 at  7:20 AM EDT by a video enabled telemedicine application and verified that I am speaking with the correct person using two identifiers. This visit type was conducted due to national recommendations for restrictions regarding the COVID-19 Pandemic (e.g. social distancing).  This format is felt to be most appropriate for this patient at this time.   I discussed the limitations of evaluation and management by telemedicine and the availability of in person appointments. The patient expressed understanding and agreed to proceed.  Only the patient and myself were on today's video visit. The patient was at home and I was in my office at the time of today's visit.   History of Present Illness:  Patient is a 48 yr old female who presents today for follow up.  HTN- maintained on hctz and Kdur.  She does not have a blood pressure cuff at home.  BP Readings from Last 3 Encounters:  02/12/18 (!) 142/88  01/24/18 124/80  12/25/17 (!) 149/87   Notes several episodes of heightened anxiety.  Reports that when this occurs she will lay down on the couch and try to calm herself down. She also reports episodes of an "internal quiver."  Feels like she is "shaking internally."  Reports that "I probably notice and think about it about 100 times a day."  Finds it very distracting. The quivering feeling is mainly in her legs. Reports that this is intermittent. Equates the feeling to a lesser version of the shaking she has had in the past with panic attacks.   Observations/Objective:  Gen: Awake, alert, no acute distress Resp: Breathing is even and non-labored Psych: calm/pleasant demeanor. Mildly anxious appearing Neuro: Alert and Oriented x 3, + facial symmetry, speech is clear.  Assessment and Plan:  Anxiety- uncontrolled. Will initiate prozac 10mg .  I instructed pt to start 1/2 tablet once daily for 1 week and then increase to a full tablet once  daily on week two as tolerated.  We discussed common side effects such as nausea, drowsiness and weight gain.  Pt verbalizes understanding.  Plan follow up in 1 month to evaluate progress.    HTN- good compliance with medications. Will need in office BP/labs when it is deemed safe to bring her into the office. Continue hctz/kdur.       Follow Up Instructions:    I discussed the assessment and treatment plan with the patient. The patient was provided an opportunity to ask questions and all were answered. The patient agreed with the plan and demonstrated an understanding of the instructions.   The patient was advised to call back or seek an in-person evaluation if the symptoms worsen or if the condition fails to improve as anticipated.    Lemont Fillers, NP

## 2018-06-11 ENCOUNTER — Telehealth (INDEPENDENT_AMBULATORY_CARE_PROVIDER_SITE_OTHER): Payer: BLUE CROSS/BLUE SHIELD | Admitting: Family

## 2018-06-11 ENCOUNTER — Other Ambulatory Visit: Payer: Self-pay

## 2018-06-11 DIAGNOSIS — F419 Anxiety disorder, unspecified: Secondary | ICD-10-CM

## 2018-06-11 MED ORDER — FLUOXETINE HCL 20 MG PO TABS: 20.0000 mg | ORAL_TABLET | Freq: Every day | ORAL | 3 refills | Status: DC

## 2018-06-11 NOTE — Progress Notes (Signed)
Virtual Visit via Video Note  I connected with Evelyn SeashoreKelly Cafiero on 06/11/18 at 11:00 AM EDT by a video enabled telemedicine application and verified that I am speaking with the correct person using two identifiers. This visit type was conducted due to national recommendations for restrictions regarding the COVID-19 Pandemic (e.g. social distancing).  This format is felt to be most appropriate for this patient at this time.   I discussed the limitations of evaluation and management by telemedicine and the availability of in person appointments. The patient expressed understanding and agreed to proceed.  Only the patient and myself were on today's video visit. The patient was at home and I was in my office at the time of today's visit.   History of Present Illness:  Patient is a 48 yr old female who presents today for follow up of her anxiety. Last visit we started her on prozac 10mg . Reports that the prozac is helping and has taken the edge off. Reports that the quiver is less pronounced. 6/10 now was 10/10. Reports that she has been mindful to pay attention to that. She denies side effects of prozac.    Past Medical History:  Diagnosis Date  . Allergy   . History of anemia 2012  . Hypertension      Social History   Socioeconomic History  . Marital status: Single    Spouse name: Not on file  . Number of children: Not on file  . Years of education: Not on file  . Highest education level: Not on file  Occupational History  . Not on file  Social Needs  . Financial resource strain: Not on file  . Food insecurity:    Worry: Not on file    Inability: Not on file  . Transportation needs:    Medical: Not on file    Non-medical: Not on file  Tobacco Use  . Smoking status: Never Smoker  . Smokeless tobacco: Never Used  Substance and Sexual Activity  . Alcohol use: Yes    Comment: 1 a month  . Drug use: No  . Sexual activity: Not on file  Lifestyle  . Physical activity:    Days per  week: Not on file    Minutes per session: Not on file  . Stress: Not on file  Relationships  . Social connections:    Talks on phone: Not on file    Gets together: Not on file    Attends religious service: Not on file    Active member of club or organization: Not on file    Attends meetings of clubs or organizations: Not on file    Relationship status: Not on file  . Intimate partner violence:    Fear of current or ex partner: Not on file    Emotionally abused: Not on file    Physically abused: Not on file    Forced sexual activity: Not on file  Other Topics Concern  . 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 CATARACT EXTRACTION Right 07/09/2016  . EYE SURGERY Right 2004   detached retnia  . EYE SURGERY Right 2012   macular hole repair  . EYE SURGERY Left 2015   last retina repair    Family History  Problem Relation  Age of Onset  . Hypertension Mother   . Breast cancer Mother 86  . Diabetes Father   . Hypertension Sister     No Known Allergies  Current Outpatient Medications on File Prior to Visit  Medication Sig Dispense Refill  . hydrochlorothiazide (HYDRODIURIL) 12.5 MG tablet Take 1 tablet (12.5 mg total) by mouth daily. 90 tablet 1  . potassium chloride SA (K-DUR) 20 MEQ tablet Take 1 tablet (20 mEq total) by mouth daily. 90 tablet 1  . [DISCONTINUED] albuterol (PROVENTIL HFA;VENTOLIN HFA) 108 (90 Base) MCG/ACT inhaler Inhale 1-2 puffs into the lungs every 6 (six) hours as needed for wheezing or shortness of breath. (Patient not taking: Reported on 05/23/2017) 1 Inhaler 0   No current facility-administered medications on file prior to visit.     There were no vitals taken for this visit.   Observations/Objective:  Gen: Awake, alert, no acute distress Resp: Breathing is even and  non-labored Psych: calm/pleasant demeanor Neuro: Alert and Oriented x 3, + facial symmetry, speech is clear.  Assessment and Plan:   Anxiety- overall anxiety is improved. She still is having some symptoms. Will increase the prozac from 10mg  to 20mg .   15 minutes spent with pt today.   Follow Up Instructions:    I discussed the assessment and treatment plan with the patient. The patient was provided an opportunity to ask questions and all were answered. The patient agreed with the plan and demonstrated an understanding of the instructions.   The patient was advised to call back or seek an in-person evaluation if the symptoms worsen or if the condition fails to improve as anticipated.    Lemont Fillers, NP

## 2018-06-12 ENCOUNTER — Telehealth: Payer: BLUE CROSS/BLUE SHIELD | Admitting: Family

## 2018-06-28 DIAGNOSIS — H43812 Vitreous degeneration, left eye: Secondary | ICD-10-CM | POA: Diagnosis not present

## 2018-06-28 DIAGNOSIS — H2512 Age-related nuclear cataract, left eye: Secondary | ICD-10-CM | POA: Diagnosis not present

## 2018-06-28 DIAGNOSIS — H31093 Other chorioretinal scars, bilateral: Secondary | ICD-10-CM | POA: Diagnosis not present

## 2018-06-28 DIAGNOSIS — H43392 Other vitreous opacities, left eye: Secondary | ICD-10-CM | POA: Diagnosis not present

## 2018-07-08 DIAGNOSIS — Z961 Presence of intraocular lens: Secondary | ICD-10-CM | POA: Diagnosis not present

## 2018-07-08 DIAGNOSIS — Z9889 Other specified postprocedural states: Secondary | ICD-10-CM | POA: Diagnosis not present

## 2018-07-22 ENCOUNTER — Ambulatory Visit (INDEPENDENT_AMBULATORY_CARE_PROVIDER_SITE_OTHER): Payer: BC Managed Care – PPO | Admitting: Family

## 2018-07-22 ENCOUNTER — Other Ambulatory Visit: Payer: Self-pay

## 2018-07-22 DIAGNOSIS — I10 Essential (primary) hypertension: Secondary | ICD-10-CM | POA: Diagnosis not present

## 2018-07-22 DIAGNOSIS — F419 Anxiety disorder, unspecified: Secondary | ICD-10-CM

## 2018-07-22 MED ORDER — AMLODIPINE BESYLATE 5 MG PO TABS: 5.0000 mg | ORAL_TABLET | Freq: Every day | ORAL | 3 refills | Status: DC

## 2018-07-22 NOTE — Progress Notes (Signed)
Virtual Visit via Video Note  I connected with Evelyn Martin on 07/22/18 at 12:00 PM EDT by a video enabled telemedicine application and verified that I am speaking with the correct person using two identifiers.  Location: Patient: home Provider: home   I discussed the limitations of evaluation and management by telemedicine and the availability of in person appointments. The patient expressed understanding and agreed to proceed.  History of Present Illness:  Patient is a 48 yr old female who presents today for follow up.  HTN- maintained on hctz 12.5 mg.  169/106 right, 158/89  147/82 pulse 103 yesterday   BP Readings from Last 3 Encounters:  02/12/18 (!) 142/88  01/24/18 124/80  12/25/17 (!) 149/87   Anxiety- reports improvement in her anxiety   Hypokalemia- maintained on kdur.  Past Medical History:  Diagnosis Date  . Allergy   . History of anemia 2012  . Hypertension      Social History   Socioeconomic History  . Marital status: Single    Spouse name: Not on file  . Number of children: Not on file  . Years of education: Not on file  . Highest education level: Not on file  Occupational History  . Not on file  Social Needs  . Financial resource strain: Not on file  . Food insecurity    Worry: Not on file    Inability: Not on file  . Transportation needs    Medical: Not on file    Non-medical: Not on file  Tobacco Use  . Smoking status: Never Smoker  . Smokeless tobacco: Never Used  Substance and Sexual Activity  . Alcohol use: Yes    Comment: 1 a month  . Drug use: No  . Sexual activity: Not on file  Lifestyle  . Physical activity    Days per week: Not on file    Minutes per session: Not on file  . Stress: Not on file  Relationships  . Social Musicianconnections    Talks on phone: Not on file    Gets together: Not on file    Attends religious service: Not on file    Active member of club or organization: Not on file    Attends meetings of clubs or  organizations: Not on file    Relationship status: Not on file  . Intimate partner violence    Fear of current or ex partner: Not on file    Emotionally abused: Not on file    Physically abused: Not on file    Forced sexual activity: Not on file  Other Topics Concern  . 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 CATARACT EXTRACTION Right 07/09/2016  . EYE SURGERY Right 2004   detached retnia  . EYE SURGERY Right 2012   macular hole repair  . EYE SURGERY Left 2015   last retina repair    Family History  Problem Relation Age of Onset  . Hypertension Mother   . Breast cancer Mother [id number]  . Diabetes Father   . Hypertension Sister     No Known Allergies  Current Outpatient Medications on File Prior to Visit  Medication Sig Dispense Refill  . FLUoxetine (PROZAC) 20 MG tablet Take 1 tablet (20 mg total) by mouth  daily. 30 tablet 3  . hydrochlorothiazide (HYDRODIURIL) 12.5 MG tablet Take 1 tablet (12.5 mg total) by mouth daily. 90 tablet 1  . potassium chloride SA (K-DUR) 20 MEQ tablet Take 1 tablet (20 mEq total) by mouth daily. 90 tablet 1  . [DISCONTINUED] albuterol (PROVENTIL HFA;VENTOLIN HFA) 108 (90 Base) MCG/ACT inhaler Inhale 1-2 puffs into the lungs every 6 (six) hours as needed for wheezing or shortness of breath. (Patient not taking: Reported on 05/23/2017) 1 Inhaler 0   No current facility-administered medications on file prior to visit.     There were no vitals taken for this visit.     Observations/Objective:   Gen: Awake, alert, no acute distress Resp: Breathing is even and non-labored Psych: calm/pleasant demeanor Neuro: Alert and Oriented x 3, + facial symmetry, speech is clear.   Assessment and Plan:  Anxiety- stable/improved on current meds.  HTN- uncontrolled. Advised pt  to stop hctz, stop Kdur. Start amlodipine once daily. Check bp once daily for 1 week then send me her readings via mychart in 1 week.     Follow Up Instructions:    I discussed the assessment and treatment plan with the patient. The patient was provided an opportunity to ask questions and all were answered. The patient agreed with the plan and demonstrated an understanding of the instructions.   The patient was advised to call back or seek an in-person evaluation if the symptoms worsen or if the condition fails to improve as anticipated.  Nance Pear, NP

## 2018-07-31 ENCOUNTER — Encounter: Payer: Self-pay | Admitting: Family

## 2018-07-31 MED ORDER — AMLODIPINE BESYLATE 10 MG PO TABS: 10.0000 mg | ORAL_TABLET | Freq: Every day | ORAL | 5 refills | Status: DC

## 2018-10-05 ENCOUNTER — Other Ambulatory Visit: Payer: Self-pay | Admitting: Family

## 2018-10-07 ENCOUNTER — Other Ambulatory Visit: Payer: Self-pay

## 2018-10-08 ENCOUNTER — Ambulatory Visit (INDEPENDENT_AMBULATORY_CARE_PROVIDER_SITE_OTHER): Payer: BC Managed Care – PPO | Admitting: Family

## 2018-10-08 ENCOUNTER — Other Ambulatory Visit: Payer: Self-pay

## 2018-10-08 ENCOUNTER — Encounter: Payer: Self-pay | Admitting: Family

## 2018-10-08 VITALS — BP 132/77 | HR 93 | Temp 97.6°F | Resp 16 | Ht 67.0 in | Wt 213.0 lb

## 2018-10-08 DIAGNOSIS — Z23 Encounter for immunization: Secondary | ICD-10-CM | POA: Diagnosis not present

## 2018-10-08 DIAGNOSIS — Z0000 Encounter for general adult medical examination without abnormal findings: Secondary | ICD-10-CM

## 2018-10-08 LAB — CBC WITH DIFFERENTIAL/PLATELET
Basophils Absolute: 0.1 10*3/uL (ref 0.0–0.1)
Basophils Relative: 1.1 % (ref 0.0–3.0)
Eosinophils Absolute: 0 10*3/uL (ref 0.0–0.7)
Eosinophils Relative: 0.6 % (ref 0.0–5.0)
HCT: 34.2 % — ABNORMAL LOW (ref 36.0–46.0)
Hemoglobin: 10.6 g/dL — ABNORMAL LOW (ref 12.0–15.0)
Lymphocytes Relative: 31.2 % (ref 12.0–46.0)
Lymphs Abs: 2 10*3/uL (ref 0.7–4.0)
MCHC: 31.1 g/dL (ref 30.0–36.0)
MCV: 72.5 fl — ABNORMAL LOW (ref 78.0–100.0)
Monocytes Absolute: 0.4 10*3/uL (ref 0.1–1.0)
Monocytes Relative: 6.6 % (ref 3.0–12.0)
Neutro Abs: 3.8 10*3/uL (ref 1.4–7.7)
Neutrophils Relative %: 60.5 % (ref 43.0–77.0)
Platelets: 381 10*3/uL (ref 150.0–400.0)
RBC: 4.71 Mil/uL (ref 3.87–5.11)
RDW: 17.6 % — ABNORMAL HIGH (ref 11.5–15.5)
WBC: 6.3 10*3/uL (ref 4.0–10.5)

## 2018-10-08 LAB — LIPID PANEL
Cholesterol: 182 mg/dL (ref 0–200)
HDL: 52.2 mg/dL (ref >39.00)
LDL Cholesterol: 106 mg/dL — ABNORMAL HIGH (ref 0–99)
NonHDL: 130.16
Total CHOL/HDL Ratio: 3
Triglycerides: 121 mg/dL (ref 0.0–149.0)
VLDL: 24.2 mg/dL (ref 0.0–40.0)

## 2018-10-08 LAB — BASIC METABOLIC PANEL
BUN: 11 mg/dL (ref 6–23)
CO2: 25 mEq/L (ref 19–32)
Calcium: 9.8 mg/dL (ref 8.4–10.5)
Chloride: 104 mEq/L (ref 96–112)
Creatinine, Ser: 0.92 mg/dL (ref 0.40–1.20)
GFR: 65.08 mL/min (ref >60.00)
Glucose, Bld: 111 mg/dL — ABNORMAL HIGH (ref 70–99)
Potassium: 4.9 mEq/L (ref 3.5–5.1)
Sodium: 137 mEq/L (ref 135–145)

## 2018-10-08 LAB — HEPATIC FUNCTION PANEL
ALT: 42 U/L — ABNORMAL HIGH (ref 0–35)
AST: 21 U/L (ref 0–37)
Albumin: 4.5 g/dL (ref 3.5–5.2)
Alkaline Phosphatase: 55 U/L (ref 39–117)
Bilirubin, Direct: 0.1 mg/dL (ref 0.0–0.3)
Total Bilirubin: 0.6 mg/dL (ref 0.2–1.2)
Total Protein: 7.9 g/dL (ref 6.0–8.3)

## 2018-10-08 LAB — TSH: TSH: 2.19 u[IU]/mL (ref 0.35–4.50)

## 2018-10-08 MED ORDER — FLUOXETINE HCL 20 MG PO TABS: 20.0000 mg | ORAL_TABLET | Freq: Every day | ORAL | 1 refills | Status: DC

## 2018-10-08 NOTE — Progress Notes (Signed)
Subjective:    Patient ID: Evelyn Martin, female    DOB: 07-09-1970, 48 y.o.   MRN: [id number]  HPI  Patient presents today for complete physical.  Immunizations: tdap 2018 Diet: fair diet Wt Readings from Last 3 Encounters:  10/08/18 213 lb (96.6 kg)  02/12/18 202 lb (91.6 kg)  01/24/18 199 lb 6 oz (90.4 kg)  Exercise: some walking/push ups Pap Smear: > 3 years ago.  Mammogram: due     Review of Systems  Constitutional: Negative for unexpected weight change.  HENT: Negative for hearing loss and rhinorrhea.   Eyes: Negative for visual disturbance.  Respiratory: Negative for cough and shortness of breath.   Cardiovascular: Negative for chest pain.  Gastrointestinal: Negative for blood in stool, constipation and diarrhea.  Genitourinary: Negative for dysuria and frequency.  Musculoskeletal: Negative for arthralgias and myalgias.  Skin: Negative for rash.  Neurological: Negative for headaches.  Hematological: Negative for adenopathy.  Psychiatric/Behavioral:       Denies current issues with depression/anxiety   Past Medical History:  Diagnosis Date  . Allergy   . History of anemia 2012  . Hypertension      Social History   Socioeconomic History  . Marital status: Single    Spouse name: Not on file  . Number of children: Not on file  . Years of education: Not on file  . Highest education level: Not on file  Occupational History  . Not on file  Social Needs  . Financial resource strain: Not on file  . Food insecurity    Worry: Not on file    Inability: Not on file  . Transportation needs    Medical: Not on file    Non-medical: Not on file  Tobacco Use  . Smoking status: Never Smoker  . Smokeless tobacco: Never Used  Substance and Sexual Activity  . Alcohol use: Yes    Comment: 1 a month  . Drug use: No  . Sexual activity: Not on file  Lifestyle  . Physical activity    Days per week: Not on file    Minutes per session: Not on file  . Stress: Not  on file  Relationships  . Social Musician on phone: Not on file    Gets together: Not on file    Attends religious service: Not on file    Active member of club or organization: Not on file    Attends meetings of clubs or organizations: Not on file    Relationship status: Not on file  . Intimate partner violence    Fear of current or ex partner: Not on file    Emotionally abused: Not on file    Physically abused: Not on file    Forced sexual activity: Not on file  Other Topics Concern  . Not on file  Social History Narrative   Lives with fiance   Works in Landscape architect   No children   No pets    From [employer]    Past Surgical History:  Procedure Laterality Date  . CATARACT EXTRACTION Right 07/09/2016  . EYE SURGERY Right 2004   detached retnia  . EYE SURGERY Right 2012   macular hole repair  . EYE SURGERY Left 2015   last retina repair    Family History  Problem Relation Age of Onset  . Hypertension Mother   . Breast cancer Mother 85  . Diabetes Father   .  Hypertension Sister     No Known Allergies  Current Outpatient Medications on File Prior to Visit  Medication Sig Dispense Refill  . amLODipine (NORVASC) 10 MG tablet Take 1 tablet (10 mg total) by mouth daily. 30 tablet 5  . FLUoxetine (PROZAC) 20 MG tablet Take 1 tablet by mouth once daily 30 tablet 0  . [DISCONTINUED] albuterol (PROVENTIL HFA;VENTOLIN HFA) 108 (90 Base) MCG/ACT inhaler Inhale 1-2 puffs into the lungs every 6 (six) hours as needed for wheezing or shortness of breath. (Patient not taking: Reported on 05/23/2017) 1 Inhaler 0   No current facility-administered medications on file prior to visit.     BP 132/77 (BP Location: Right Arm, Patient Position: Sitting, Cuff Size: Small)   Pulse 93   Temp 97.6 F (36.4 C) (Temporal)   Resp 16   Ht 5\' 7"  (1.702 m)   Wt 213 lb (96.6 kg)   SpO2 100%   BMI 33.36 kg/m       Objective:   Physical  Exam  Physical Exam  Constitutional: She is oriented to person, place, and time. She appears well-developed and well-nourished. No distress.  HENT:  Head: Normocephalic and atraumatic.  Right Ear: Tympanic membrane and ear canal normal.  Left Ear: Tympanic membrane and ear canal normal.  Mouth/Throat: Oropharynx is clear and moist.  Eyes: Pupils are equal, round, and reactive to light. No scleral icterus.  Neck: Normal range of motion. No thyromegaly present.  Cardiovascular: Normal rate and regular rhythm.   No murmur heard. Pulmonary/Chest: Effort normal and breath sounds normal. No respiratory distress. He has no wheezes. She has no rales. She exhibits no tenderness.  Abdominal: Soft. Bowel sounds are normal. She exhibits no distension and no mass. There is no tenderness. There is no rebound and no guarding.  Musculoskeletal: She exhibits no edema.  Lymphadenopathy:    She has no cervical adenopathy.  Neurological: She is alert and oriented to person, place, and time. She has normal patellar reflexes. She exhibits normal muscle tone. Coordination normal.  Skin: Skin is warm and dry.  Psychiatric: She has a normal mood and affect. Her behavior is normal. Judgment and thought content normal.  Breasts: Examined lying Right: Without masses, retractions, discharge or axillary adenopathy.  Left: Without masses, retractions, discharge or axillary adenopathy.  Pelvic: deferred to GYN       Assessment & Plan:   Preventative care- discussed healthy diet, regular exercise and weight loss. Refer for mammogram, GYN for pap.  Obtain routine lab work. Flu shot today.       Assessment & Plan:

## 2018-10-08 NOTE — Patient Instructions (Addendum)
Please call Dr. Vladimir Creeks office to schedule visit for pap.   [phone]

## 2018-10-09 ENCOUNTER — Other Ambulatory Visit (INDEPENDENT_AMBULATORY_CARE_PROVIDER_SITE_OTHER): Payer: BC Managed Care – PPO

## 2018-10-09 DIAGNOSIS — D649 Anemia, unspecified: Secondary | ICD-10-CM | POA: Diagnosis not present

## 2018-10-09 LAB — FERRITIN: Ferritin: 2.6 ng/mL — ABNORMAL LOW (ref 10.0–291.0)

## 2018-10-09 LAB — IRON: Iron: 32 ug/dL — ABNORMAL LOW (ref 42–145)

## 2018-10-10 ENCOUNTER — Telehealth: Payer: Self-pay | Admitting: Family

## 2018-10-10 DIAGNOSIS — D509 Iron deficiency anemia, unspecified: Secondary | ICD-10-CM

## 2018-10-10 MED ORDER — IRON 325 (65 FE) MG PO TABS: 1.0000 | ORAL_TABLET | Freq: Two times a day (BID) | ORAL | 0 refills | Status: AC

## 2018-10-10 NOTE — Telephone Encounter (Signed)
Please contact pt and let her know that her lab work shows iron deficiency anemia. I would like her to add iron 325mg  po bid (available otc) and complete IFOB.  Repeat labs in 3 months. Blood work orders have been placed. Please order IFOB.

## 2018-10-10 NOTE — Telephone Encounter (Signed)
Called but no answer and mailbox was full. Will try to call patient again tomorrow.

## 2018-10-11 ENCOUNTER — Other Ambulatory Visit: Payer: Self-pay

## 2018-10-11 DIAGNOSIS — D509 Iron deficiency anemia, unspecified: Secondary | ICD-10-CM

## 2018-10-11 NOTE — Telephone Encounter (Signed)
Patient called back, was advised of results and provider's advise. She will start iron supplements as indicated. She will come by the office next week to pick up IFOB. Order for test entered.

## 2018-10-11 NOTE — Telephone Encounter (Signed)
Lvm again today for patient to return my call about results and provider's instructions

## 2018-12-09 NOTE — Telephone Encounter (Signed)
Lvm for patient to call back about lab appointment and information about completing IFOB

## 2018-12-09 NOTE — Telephone Encounter (Signed)
See below.  Please contact pt to remind her to return IFOB at her earliest convenience.  Also, please schedule a 3 month lab visit to repeat her cbc.

## 2018-12-11 NOTE — Telephone Encounter (Signed)
Lvm for patient to cal about this past due test requested by pcp. Will also send her a [employer].

## 2018-12-16 ENCOUNTER — Encounter: Payer: Self-pay | Admitting: Family

## 2018-12-17 NOTE — Progress Notes (Signed)
Mailed out to patient 

## 2019-01-14 DIAGNOSIS — H43392 Other vitreous opacities, left eye: Secondary | ICD-10-CM | POA: Diagnosis not present

## 2019-01-14 DIAGNOSIS — H31093 Other chorioretinal scars, bilateral: Secondary | ICD-10-CM | POA: Diagnosis not present

## 2019-01-14 DIAGNOSIS — H43822 Vitreomacular adhesion, left eye: Secondary | ICD-10-CM | POA: Diagnosis not present

## 2019-01-14 DIAGNOSIS — H43812 Vitreous degeneration, left eye: Secondary | ICD-10-CM | POA: Diagnosis not present

## 2019-01-24 ENCOUNTER — Other Ambulatory Visit: Payer: Self-pay | Admitting: Family

## 2019-02-09 ENCOUNTER — Encounter: Payer: Self-pay | Admitting: Family

## 2019-02-10 ENCOUNTER — Other Ambulatory Visit: Payer: Self-pay

## 2019-02-10 MED ORDER — FLUOXETINE HCL 20 MG PO CAPS: 20.0000 mg | ORAL_CAPSULE | Freq: Every day | ORAL | 0 refills | Status: DC

## 2019-02-13 ENCOUNTER — Encounter: Payer: Self-pay | Admitting: Family

## 2019-02-13 ENCOUNTER — Other Ambulatory Visit: Payer: Self-pay

## 2019-02-13 MED ORDER — AMLODIPINE BESYLATE 10 MG PO TABS: 10.0000 mg | ORAL_TABLET | Freq: Every day | ORAL | 1 refills | Status: DC

## 2019-02-13 NOTE — Telephone Encounter (Signed)
Patient advised she received 90 days supply from [facility] earlier this week and we will need to wait closer to her needing a refill to send it to [facility]. [facility]ine sent to [facility] at her request

## 2019-04-23 ENCOUNTER — Other Ambulatory Visit: Payer: Self-pay | Admitting: Family

## 2019-04-23 MED ORDER — FLUOXETINE HCL 20 MG PO CAPS: 20.0000 mg | ORAL_CAPSULE | Freq: Every day | ORAL | 1 refills | Status: DC

## 2019-07-18 DIAGNOSIS — H43812 Vitreous degeneration, left eye: Secondary | ICD-10-CM | POA: Diagnosis not present

## 2019-07-18 DIAGNOSIS — H43392 Other vitreous opacities, left eye: Secondary | ICD-10-CM | POA: Diagnosis not present

## 2019-07-18 DIAGNOSIS — H31093 Other chorioretinal scars, bilateral: Secondary | ICD-10-CM | POA: Diagnosis not present

## 2019-07-18 DIAGNOSIS — H2512 Age-related nuclear cataract, left eye: Secondary | ICD-10-CM | POA: Diagnosis not present

## 2019-07-21 ENCOUNTER — Other Ambulatory Visit: Payer: Self-pay | Admitting: Family

## 2019-09-30 ENCOUNTER — Other Ambulatory Visit: Payer: Self-pay | Admitting: Family Medicine

## 2019-09-30 ENCOUNTER — Other Ambulatory Visit: Payer: Self-pay | Admitting: Family

## 2019-10-02 ENCOUNTER — Encounter: Payer: Self-pay | Admitting: Family

## 2019-10-03 MED ORDER — FLUOXETINE HCL 20 MG PO CAPS: 20.0000 mg | ORAL_CAPSULE | Freq: Every day | ORAL | 0 refills | Status: DC

## 2019-10-03 NOTE — Addendum Note (Signed)
Addended by: Sandford Craze on: 10/03/2019 10:58 AM   Modules accepted: Orders

## 2019-10-27 ENCOUNTER — Telehealth: Payer: Self-pay | Admitting: Family

## 2019-10-27 ENCOUNTER — Other Ambulatory Visit: Payer: Self-pay | Admitting: Family

## 2019-10-27 NOTE — Telephone Encounter (Signed)
See mychart.  

## 2019-11-19 ENCOUNTER — Other Ambulatory Visit: Payer: Self-pay | Admitting: Family

## 2019-12-19 ENCOUNTER — Other Ambulatory Visit: Payer: Self-pay | Admitting: Family

## 2020-01-07 ENCOUNTER — Other Ambulatory Visit: Payer: Self-pay | Admitting: Family

## 2020-02-08 ENCOUNTER — Other Ambulatory Visit: Payer: Self-pay | Admitting: Family
